# Patient Record
Sex: Female | Born: 1985 | Race: White | Hispanic: No | Marital: Married | Smoking: Never smoker
Health system: Southern US, Community
[De-identification: ages and names within clinical notes are randomized; demographics above are authoritative.]

## PROBLEM LIST (undated history)

## (undated) DIAGNOSIS — Z9889 Other specified postprocedural states: Secondary | ICD-10-CM

## (undated) DIAGNOSIS — Z789 Other specified health status: Secondary | ICD-10-CM

## (undated) DIAGNOSIS — D696 Thrombocytopenia, unspecified: Secondary | ICD-10-CM

## (undated) DIAGNOSIS — D0362 Melanoma in situ of left upper limb, including shoulder: Secondary | ICD-10-CM

## (undated) DIAGNOSIS — R112 Nausea with vomiting, unspecified: Secondary | ICD-10-CM

## (undated) HISTORY — DX: Melanoma in situ of left upper limb, including shoulder: D03.62

## (undated) HISTORY — PX: TONSILLECTOMY: SUR1361

---

## 2006-04-20 HISTORY — PX: RETINAL TEAR REPAIR CRYOTHERAPY: SHX5304

## 2010-01-13 ENCOUNTER — Ambulatory Visit: Payer: Self-pay | Admitting: Specialist

## 2011-01-18 ENCOUNTER — Observation Stay: Payer: Self-pay

## 2011-01-19 ENCOUNTER — Inpatient Hospital Stay: Payer: Self-pay

## 2013-01-16 ENCOUNTER — Ambulatory Visit: Payer: Self-pay | Admitting: Physician Assistant

## 2013-07-16 ENCOUNTER — Ambulatory Visit: Payer: Self-pay | Admitting: Family Medicine

## 2013-07-16 LAB — URINALYSIS, COMPLETE
Bilirubin,UR: NEGATIVE
Glucose,UR: NEGATIVE mg/dL (ref 0–75)
Ketone: NEGATIVE
NITRITE: NEGATIVE
PH: 7 (ref 4.5–8.0)
Protein: NEGATIVE
Specific Gravity: 1.005 (ref 1.003–1.030)
WBC UR: >30 /HPF (ref 0–5)

## 2013-07-16 LAB — PREGNANCY, URINE: Pregnancy Test, Urine: NEGATIVE m[IU]/mL

## 2013-07-18 LAB — URINE CULTURE

## 2014-04-06 ENCOUNTER — Ambulatory Visit: Payer: Self-pay | Admitting: Family Medicine

## 2014-08-04 ENCOUNTER — Other Ambulatory Visit
Admit: 2014-08-04 | Disposition: A | Discharge status: Home / Self Care | Payer: Self-pay | Attending: Advanced Practice Midwife | Admitting: Advanced Practice Midwife

## 2014-08-04 LAB — HCG, QUANTITATIVE, PREGNANCY: Beta Hcg, Quant.: 79 m[IU]/mL — ABNORMAL HIGH

## 2015-03-13 ENCOUNTER — Ambulatory Visit
Admission: EM | Admit: 2015-03-13 | Discharge: 2015-03-13 | Disposition: A | Discharge status: Home / Self Care | Payer: BLUE CROSS/BLUE SHIELD | Attending: Family Medicine | Admitting: Family Medicine

## 2015-03-13 DIAGNOSIS — L817 Pigmented purpuric dermatosis: Secondary | ICD-10-CM | POA: Diagnosis not present

## 2015-03-13 DIAGNOSIS — D696 Thrombocytopenia, unspecified: Secondary | ICD-10-CM | POA: Diagnosis not present

## 2015-03-13 HISTORY — DX: Thrombocytopenia, unspecified: D69.6

## 2015-03-13 LAB — CBC WITH DIFFERENTIAL/PLATELET
Basophils Absolute: 0.2 10*3/uL — ABNORMAL HIGH (ref 0–0.1)
Basophils Relative: 1 %
EOS PCT: 0 %
Eosinophils Absolute: 0 10*3/uL (ref 0–0.7)
HCT: 37.9 % (ref 35.0–47.0)
Hemoglobin: 13.5 g/dL (ref 12.0–16.0)
LYMPHS PCT: 13 %
Lymphs Abs: 1.6 10*3/uL (ref 1.0–3.6)
MCH: 34.7 pg — ABNORMAL HIGH (ref 26.0–34.0)
MCHC: 35.5 g/dL (ref 32.0–36.0)
MCV: 97.7 fL (ref 80.0–100.0)
Monocytes Absolute: 0.8 10*3/uL (ref 0.2–0.9)
Monocytes Relative: 6 %
NEUTROS PCT: 80 %
Neutro Abs: 10.1 10*3/uL — ABNORMAL HIGH (ref 1.4–6.5)
PLATELETS: 124 10*3/uL — AB (ref 150–440)
RBC: 3.88 MIL/uL (ref 3.80–5.20)
RDW: 13.2 % (ref 11.5–14.5)
WBC: 12.7 10*3/uL — ABNORMAL HIGH (ref 3.6–11.0)

## 2015-03-13 NOTE — ED Provider Notes (Signed)
CSN: [id number]     Arrival date & time 03/13/15  1139 History   First MD Initiated Contact with Patient 03/13/15 1314     Chief Complaint  Patient presents with  . Rash   (Consider location/radiation/quality/duration/timing/severity/associated sxs/prior Treatment) HPI Comments: 29 yo female with a h/o mild thrombocytopenia, [redacted] weeks pregnant, presents with a 1 day h/o rash to back of legs. States she was sitting for several hours yesterday and noticed rash last night after shower. Denies any tenderness, itching, pain, blisters, drainage, new soaps, detergents, or contact with plants or chemicals.  The history is provided by the patient.    Past Medical History  Diagnosis Date  . Platelets decreased ([place])    Past Surgical History  Procedure Laterality Date  . Tonsillectomy     History reviewed. No pertinent family history. Social History  Substance Use Topics  . Smoking status: Never Smoker   . Smokeless tobacco: None  . Alcohol Use: No   OB History    Gravida Para Term Preterm AB TAB SAB Ectopic Multiple Living   1              Review of Systems  Allergies  Macrobid  Home Medications   Prior to Admission medications   Medication Sig Start Date End Date Taking? Authorizing Provider  Prenatal Vit-Fe Fumarate-FA (PRENATAL VITAMINS) 28-0.8 MG TABS Take by mouth.   Yes Historical Provider, MD   Meds Ordered and Administered this Visit  Medications - No data to display  BP 113/55 mmHg  Pulse 99  Temp(Src) 97.9 F (36.6 C) (Tympanic)  Resp 16  Ht 5\' 4"  (1.626 m)  Wt 154 lb (69.854 kg)  BMI 26.42 kg/m2  SpO2 100%  LMP 08/02/2014 (Exact Date) No data found.   Physical Exam  Constitutional: She appears well-developed and well-nourished. No distress.  Pulmonary/Chest: Effort normal. No respiratory distress.  Skin: Rash noted. She is not diaphoretic.  Diffuse blanchable reddish, brown diffuse macular rash on posterior upper thigh area; non-tender; no warmth,  drainage or vesicular lesions  Nursing note and vitals reviewed.   ED Course  Procedures (including critical care time)  Labs Review Labs Reviewed  CBC WITH DIFFERENTIAL/PLATELET - Abnormal; Notable for the following:    WBC 12.7 (*)    MCH 34.7 (*)    Platelets 124 (*)    Neutro Abs 10.1 (*)    Basophils Absolute 0.2 (*)    All other components within normal limits    Imaging Review No results found.   Visual Acuity Review  Right Eye Distance:   Left Eye Distance:   Bilateral Distance:    Right Eye Near:   Left Eye Near:    Bilateral Near:         MDM   1. Pigmented purpuric dermatosis   2. Thrombocytopenia ([place])    1. Lab results (CBC) and diagnosis reviewed with patient 2. Recommend supportive treatment with otc behadryl prn (if needed for itching) 3. Follow-up prn if symptoms worsen or don't improve 4. F/u with her OB as scheduled   Norval Gable, MD 03/13/15 2148

## 2015-03-13 NOTE — ED Notes (Signed)
Noted last night to have red rash lower buttocks and upper posterior thighs. Not itching or painful. [redacted] weeks pregnant with [facility] 05/08/15

## 2015-04-10 ENCOUNTER — Inpatient Hospital Stay: Payer: BLUE CROSS/BLUE SHIELD

## 2015-04-10 ENCOUNTER — Inpatient Hospital Stay: Payer: BLUE CROSS/BLUE SHIELD | Attending: Oncology | Admitting: Oncology

## 2015-04-10 VITALS — BP 113/75 | HR 84 | Temp 97.1°F | Resp 18 | Wt 159.6 lb

## 2015-04-10 DIAGNOSIS — D696 Thrombocytopenia, unspecified: Secondary | ICD-10-CM | POA: Diagnosis not present

## 2015-04-10 DIAGNOSIS — Z79899 Other long term (current) drug therapy: Secondary | ICD-10-CM | POA: Diagnosis not present

## 2015-04-10 DIAGNOSIS — O99113 Other diseases of the blood and blood-forming organs and certain disorders involving the immune mechanism complicating pregnancy, third trimester: Secondary | ICD-10-CM | POA: Diagnosis not present

## 2015-04-10 LAB — CBC
HEMATOCRIT: 38 % (ref 35.0–47.0)
HEMOGLOBIN: 13.4 g/dL (ref 12.0–16.0)
MCH: 34.8 pg — ABNORMAL HIGH (ref 26.0–34.0)
MCHC: 35.1 g/dL (ref 32.0–36.0)
MCV: 99.1 fL (ref 80.0–100.0)
Platelets: 129 10*3/uL — ABNORMAL LOW (ref 150–440)
RBC: 3.84 MIL/uL (ref 3.80–5.20)
RDW: 12.9 % (ref 11.5–14.5)
WBC: 11 10*3/uL (ref 3.6–11.0)

## 2015-04-10 LAB — FERRITIN: Ferritin: 30 ng/mL (ref 11–307)

## 2015-04-10 LAB — FOLATE: FOLATE: 17.6 ng/mL (ref >5.9)

## 2015-04-10 NOTE — Progress Notes (Signed)
Patient is pregnant with her 2nd child with due date on 05/08/2015.  With the first pregnancy she did have low platelets but was unknown at the time.

## 2015-04-11 LAB — VITAMIN B12: Vitamin B-12: 310 pg/mL (ref 180–914)

## 2015-04-12 NOTE — Progress Notes (Signed)
[place]  [phone] [phone]  ID: Mammie Pauls OB: 03-Sep-1985  MR#: [id number]  [id number]  Patient Care Team: Malachy Mood, MD as PCP - General (Obstetrics and Gynecology)  CHIEF COMPLAINT:  Chief Complaint  Patient presents with  . New Evaluation    gestational thrombocytopenia    INTERVAL HISTORY: Patient is a 29 year old female who is pregnant with her second child and her due date is May 08, 2015. She was found to have a mildly decreased platelet count and has been referred for further evaluation. Patient also had thrombocytopenia with her first pregnancy, but was unaware until she was unable to have an epidural placed during labor. Currently, she feels well and is asymptomatic. She denies any easy bleeding or bruising. She is gaining weight appropriately. She has no neurologic complaints. She denies any recent fevers. She has no chest pain or shortness of breath. She denies any nausea, vomiting, constipation, or diarrhea. She has no urinary complaints. Patient offers no specific complaints today.  REVIEW OF SYSTEMS:   Review of Systems  Constitutional: Negative.  Negative for fever and malaise/fatigue.  Respiratory: Negative.   Cardiovascular: Negative.   Gastrointestinal: Negative.   Musculoskeletal: Negative.   Neurological: Negative.  Negative for weakness.  Endo/Heme/Allergies: Does not bruise/bleed easily.    As per HPI. Otherwise, a complete review of systems is negatve.  PAST MEDICAL HISTORY: Past Medical History  Diagnosis Date  . Platelets decreased ([place])     PAST SURGICAL HISTORY: Past Surgical History  Procedure Laterality Date  . Tonsillectomy      FAMILY HISTORY: Reviewed and unchanged. No reported history of chronic disease.     ADVANCED DIRECTIVES:    HEALTH MAINTENANCE: Social History  Substance Use Topics  . Smoking status: Never Smoker   . Smokeless tobacco: Not on file  .  Alcohol Use: No     Colonoscopy:  PAP:  Bone density:  Lipid panel:  Allergies  Allergen Reactions  . Macrobid [Nitrofurantoin Monohyd Macro] Hives    Current Outpatient Prescriptions  Medication Sig Dispense Refill  . amoxicillin-clavulanate (AUGMENTIN) 875-125 MG tablet TK 1 T PO  BID FOR 10 DAYS.  0  . benzonatate (TESSALON) 100 MG capsule TK 1 C PO Q 8 HOURS PRF COUGH. SWALLOW WHOLE  0  . Prenatal Vit-Fe Fumarate-FA (PRENATAL VITAMINS) 28-0.8 MG TABS Take by mouth.    Marland Kitchen PULMICORT FLEXHALER 90 MCG/ACT inhaler   2   No current facility-administered medications for this visit.    OBJECTIVE: Filed Vitals:   04/10/15 1520  BP: 113/75  Pulse: 84  Temp: 97.1 F (36.2 C)  Resp: 18     Body mass index is 27.38 kg/(m^2).    ECOG FS:0 - Asymptomatic  General: Well-developed, well-nourished, no acute distress. Eyes: Pink conjunctiva, anicteric sclera. HEENT: Normocephalic, moist mucous membranes, clear oropharnyx. Lungs: Clear to auscultation bilaterally. Heart: Regular rate and rhythm. No rubs, murmurs, or gallops. Abdomen: Appears appropriate for gestational age. Musculoskeletal: No edema, cyanosis, or clubbing. Neuro: Alert, answering all questions appropriately. Cranial nerves grossly intact. Skin: No rashes or petechiae noted. Psych: Normal affect. Lymphatics: No cervical, calvicular, axillary or inguinal LAD.   LAB RESULTS:  No results found for: NA, K, CL, CO2, GLUCOSE, BUN, CREATININE, CALCIUM, PROT, ALBUMIN, AST, ALT, ALKPHOS, BILITOT, GFRNONAA, GFRAA  Lab Results  Component Value Date   WBC 11.0 04/10/2015   NEUTROABS 10.1* 03/13/2015   HGB 13.4 04/10/2015   HCT 38.0 04/10/2015   MCV 99.1  04/10/2015   PLT 129* 04/10/2015     STUDIES: No results found.  ASSESSMENT: Thrombocytopenia in pregnancy.  PLAN:    1. Thrombocytopenia: Patient's platelet count is only mildly decreased at 129. The remainder of her lab work is either negative or within normal  limits. No intervention is needed at this time. Goal will be to maintain platelet count greater than 100 at the time of birth. Patient will return to clinic in mid-January for repeat laboratory work and further evaluation. 2. Pregnancy: Patient's due date is May 08, 2015, although she states that she has a scheduled induction 1 week prior. If patient's platelet count is less than 100 at time of induction, would try oral prednisone 1 mg/kg daily in attempt to improve her platelet count. 3. Follow-up: Mid-January as above. If patient gives birth prior, will schedule follow-up 2-3 months postpartum to ensure platelets have returned to normal.  Patient expressed understanding and was in agreement with this plan. She also understands that She can call clinic at any time with any questions, concerns, or complaints.    Lloyd Huger, MD   04/12/2015 1:34 PM

## 2015-05-03 ENCOUNTER — Observation Stay: Payer: BLUE CROSS/BLUE SHIELD | Admitting: Anesthesiology

## 2015-05-03 ENCOUNTER — Observation Stay
Admission: EM | Admit: 2015-05-03 | Discharge: 2015-05-03 | Disposition: A | Discharge status: Home / Self Care | Payer: BLUE CROSS/BLUE SHIELD | Source: Home / Self Care | Admitting: Obstetrics and Gynecology

## 2015-05-03 ENCOUNTER — Inpatient Hospital Stay
Admission: EM | Admit: 2015-05-03 | Discharge: 2015-05-06 | DRG: 765 | Disposition: A | Discharge status: Home / Self Care | Payer: BLUE CROSS/BLUE SHIELD | Attending: Obstetrics & Gynecology | Admitting: Obstetrics & Gynecology

## 2015-05-03 ENCOUNTER — Encounter
Admission: EM | Disposition: A | Discharge status: Home / Self Care | Payer: Self-pay | Source: Home / Self Care | Attending: Obstetrics & Gynecology

## 2015-05-03 ENCOUNTER — Encounter: Payer: Self-pay | Admitting: Anesthesiology

## 2015-05-03 ENCOUNTER — Inpatient Hospital Stay
Admission: RE | Admit: 2015-05-03 | Payer: BLUE CROSS/BLUE SHIELD | Source: Ambulatory Visit | Admitting: Obstetrics and Gynecology

## 2015-05-03 DIAGNOSIS — Z98891 History of uterine scar from previous surgery: Secondary | ICD-10-CM

## 2015-05-03 DIAGNOSIS — D62 Acute posthemorrhagic anemia: Secondary | ICD-10-CM | POA: Diagnosis not present

## 2015-05-03 DIAGNOSIS — O9912 Other diseases of the blood and blood-forming organs and certain disorders involving the immune mechanism complicating childbirth: Secondary | ICD-10-CM | POA: Diagnosis present

## 2015-05-03 DIAGNOSIS — O9081 Anemia of the puerperium: Secondary | ICD-10-CM | POA: Diagnosis not present

## 2015-05-03 DIAGNOSIS — Z3A39 39 weeks gestation of pregnancy: Secondary | ICD-10-CM | POA: Diagnosis not present

## 2015-05-03 DIAGNOSIS — Z3493 Encounter for supervision of normal pregnancy, unspecified, third trimester: Secondary | ICD-10-CM

## 2015-05-03 DIAGNOSIS — D696 Thrombocytopenia, unspecified: Secondary | ICD-10-CM | POA: Diagnosis present

## 2015-05-03 DIAGNOSIS — Z3483 Encounter for supervision of other normal pregnancy, third trimester: Secondary | ICD-10-CM | POA: Diagnosis present

## 2015-05-03 HISTORY — DX: Other specified health status: Z78.9

## 2015-05-03 HISTORY — DX: Other specified postprocedural states: Z98.890

## 2015-05-03 HISTORY — DX: Nausea with vomiting, unspecified: R11.2

## 2015-05-03 LAB — TYPE AND SCREEN
ABO/RH(D): O NEG
ANTIBODY SCREEN: POSITIVE

## 2015-05-03 LAB — CBC WITH DIFFERENTIAL/PLATELET
BASOS PCT: 1 %
Basophils Absolute: 0.2 10*3/uL — ABNORMAL HIGH (ref 0–0.1)
EOS ABS: 0 10*3/uL (ref 0–0.7)
Eosinophils Relative: 0 %
HCT: 41.3 % (ref 35.0–47.0)
HEMOGLOBIN: 14 g/dL (ref 12.0–16.0)
LYMPHS ABS: 1.3 10*3/uL (ref 1.0–3.6)
Lymphocytes Relative: 7 %
MCH: 33.6 pg (ref 26.0–34.0)
MCHC: 34 g/dL (ref 32.0–36.0)
MCV: 98.9 fL (ref 80.0–100.0)
Monocytes Absolute: 0.9 10*3/uL (ref 0.2–0.9)
Monocytes Relative: 5 %
NEUTROS ABS: 15.6 10*3/uL — AB (ref 1.4–6.5)
NEUTROS PCT: 87 %
Platelets: 123 10*3/uL — ABNORMAL LOW (ref 150–440)
RBC: 4.17 MIL/uL (ref 3.80–5.20)
RDW: 12.9 % (ref 11.5–14.5)
WBC: 18 10*3/uL — AB (ref 3.6–11.0)

## 2015-05-03 LAB — ABO/RH: ABO/RH(D): O NEG

## 2015-05-03 LAB — RAPID HIV SCREEN (HIV 1/2 AB+AG)
HIV 1/2 ANTIBODIES: NONREACTIVE
HIV-1 P24 ANTIGEN - HIV24: NONREACTIVE

## 2015-05-03 SURGERY — Surgical Case
Anesthesia: Spinal

## 2015-05-03 MED ORDER — OXYCODONE-ACETAMINOPHEN 5-325 MG PO TABS: 1.0000 | ORAL_TABLET | ORAL | Status: DC | PRN

## 2015-05-03 MED ORDER — CITRIC ACID-SODIUM CITRATE 334-500 MG/5ML PO SOLN
30.0000 mL | ORAL | Status: AC
  Administered 2015-05-03: 30 mL via ORAL
  Filled 2015-05-03: qty 15

## 2015-05-03 MED ORDER — MEPERIDINE HCL 25 MG/ML IJ SOLN
INTRAMUSCULAR | Status: AC
  Administered 2015-05-03: 6.25 mg via INTRAVENOUS
  Filled 2015-05-03: qty 1

## 2015-05-03 MED ORDER — ZOLPIDEM TARTRATE 5 MG PO TABS: 5.0000 mg | ORAL_TABLET | Freq: Every evening | ORAL | Status: DC | PRN

## 2015-05-03 MED ORDER — BUPIVACAINE 0.25 % ON-Q PUMP DUAL CATH 400 ML
INJECTION | Status: AC
  Filled 2015-05-03: qty 400

## 2015-05-03 MED ORDER — OXYCODONE-ACETAMINOPHEN 5-325 MG PO TABS: 2.0000 | ORAL_TABLET | ORAL | Status: DC | PRN

## 2015-05-03 MED ORDER — ACETAMINOPHEN 325 MG PO TABS: 650.0000 mg | ORAL_TABLET | ORAL | Status: DC | PRN

## 2015-05-03 MED ORDER — LACTATED RINGERS IV SOLN
INTRAVENOUS | Status: DC
  Administered 2015-05-03: 17:00:00 via INTRAVENOUS

## 2015-05-03 MED ORDER — CITRIC ACID-SODIUM CITRATE 334-500 MG/5ML PO SOLN: 30.0000 mL | ORAL | Status: DC | PRN

## 2015-05-03 MED ORDER — ONDANSETRON HCL 4 MG/2ML IJ SOLN: 4.0000 mg | Freq: Once | INTRAMUSCULAR | Status: DC | PRN

## 2015-05-03 MED ORDER — CEFAZOLIN SODIUM-DEXTROSE 2-3 GM-% IV SOLR
2.0000 g | INTRAVENOUS | Status: AC
  Administered 2015-05-03: 2 g via INTRAVENOUS

## 2015-05-03 MED ORDER — LIDOCAINE HCL (PF) 1 % IJ SOLN: 30.0000 mL | INTRAMUSCULAR | Status: DC | PRN

## 2015-05-03 MED ORDER — LACTATED RINGERS IV SOLN
INTRAVENOUS | Status: DC
  Administered 2015-05-04: via INTRAVENOUS

## 2015-05-03 MED ORDER — LACTATED RINGERS IV SOLN: 500.0000 mL | INTRAVENOUS | Status: DC | PRN

## 2015-05-03 MED ORDER — BUPIVACAINE 0.25 % ON-Q PUMP DUAL CATH 400 ML: 400.0000 mL | INJECTION | Status: DC

## 2015-05-03 MED ORDER — LACTATED RINGERS IV BOLUS (SEPSIS)
1000.0000 mL | Freq: Once | INTRAVENOUS | Status: AC
Start: 2015-05-03 — End: 2015-05-03
  Administered 2015-05-03: 1000 mL via INTRAVENOUS

## 2015-05-03 MED ORDER — BUPIVACAINE HCL 0.5 % IJ SOLN
10.0000 mL | Freq: Once | INTRAMUSCULAR | Status: DC
  Filled 2015-05-03: qty 10

## 2015-05-03 MED ORDER — PHENYLEPHRINE HCL 10 MG/ML IJ SOLN
INTRAMUSCULAR | Status: DC | PRN
  Administered 2015-05-03 (×2): 50 ug via INTRAVENOUS

## 2015-05-03 MED ORDER — KETOROLAC TROMETHAMINE 30 MG/ML IJ SOLN
30.0000 mg | Freq: Four times a day (QID) | INTRAMUSCULAR | Status: AC
  Administered 2015-05-04 (×4): 30 mg via INTRAVENOUS
  Filled 2015-05-03 (×4): qty 1

## 2015-05-03 MED ORDER — BUTORPHANOL TARTRATE 1 MG/ML IJ SOLN
1.0000 mg | INTRAMUSCULAR | Status: DC | PRN
Start: 2015-05-03 — End: 2015-05-03
  Administered 2015-05-03: 1 mg via INTRAVENOUS

## 2015-05-03 MED ORDER — SODIUM CHLORIDE 0.9 % IR SOLN
Status: DC | PRN
  Administered 2015-05-03: 500 mL

## 2015-05-03 MED ORDER — BUTORPHANOL TARTRATE 1 MG/ML IJ SOLN
INTRAMUSCULAR | Status: AC
  Administered 2015-05-03: 1 mg via INTRAVENOUS
  Filled 2015-05-03: qty 2

## 2015-05-03 MED ORDER — MISOPROSTOL 200 MCG PO TABS
ORAL_TABLET | ORAL | Status: AC
  Filled 2015-05-03: qty 4

## 2015-05-03 MED ORDER — SIMETHICONE 80 MG PO CHEW
80.0000 mg | CHEWABLE_TABLET | Freq: Three times a day (TID) | ORAL | Status: DC
  Administered 2015-05-04 – 2015-05-06 (×7): 80 mg via ORAL
  Filled 2015-05-03 (×7): qty 1

## 2015-05-03 MED ORDER — SENNOSIDES-DOCUSATE SODIUM 8.6-50 MG PO TABS
2.0000 | ORAL_TABLET | ORAL | Status: DC
  Administered 2015-05-04 – 2015-05-06 (×3): 2 via ORAL
  Filled 2015-05-03 (×3): qty 2

## 2015-05-03 MED ORDER — LANOLIN HYDROUS EX OINT: 1.0000 "application " | TOPICAL_OINTMENT | CUTANEOUS | Status: DC | PRN

## 2015-05-03 MED ORDER — PRENATAL MULTIVITAMIN CH
1.0000 | ORAL_TABLET | Freq: Every day | ORAL | Status: DC
  Administered 2015-05-04 – 2015-05-06 (×3): 1 via ORAL
  Filled 2015-05-03 (×3): qty 1

## 2015-05-03 MED ORDER — BUPIVACAINE IN DEXTROSE 0.75-8.25 % IT SOLN
INTRATHECAL | Status: DC | PRN
  Administered 2015-05-03: 1.8 mL via INTRATHECAL

## 2015-05-03 MED ORDER — SIMETHICONE 80 MG PO CHEW
80.0000 mg | CHEWABLE_TABLET | ORAL | Status: DC
  Administered 2015-05-04 – 2015-05-06 (×3): 80 mg via ORAL
  Filled 2015-05-03 (×3): qty 1

## 2015-05-03 MED ORDER — FENTANYL CITRATE (PF) 100 MCG/2ML IJ SOLN: 25.0000 ug | INTRAMUSCULAR | Status: DC | PRN

## 2015-05-03 MED ORDER — WITCH HAZEL-GLYCERIN EX PADS: 1.0000 "application " | MEDICATED_PAD | CUTANEOUS | Status: DC | PRN

## 2015-05-03 MED ORDER — MEPERIDINE HCL 25 MG/ML IJ SOLN
6.2500 mg | INTRAMUSCULAR | Status: AC | PRN
Start: 2015-05-03 — End: 2015-05-03
  Administered 2015-05-03 (×2): 6.25 mg via INTRAVENOUS

## 2015-05-03 MED ORDER — DIPHENHYDRAMINE HCL 25 MG PO CAPS: 25.0000 mg | ORAL_CAPSULE | Freq: Four times a day (QID) | ORAL | Status: DC | PRN

## 2015-05-03 MED ORDER — ONDANSETRON HCL 4 MG/2ML IJ SOLN
INTRAMUSCULAR | Status: DC | PRN
  Administered 2015-05-03: 4 mg via INTRAVENOUS

## 2015-05-03 MED ORDER — CEFAZOLIN SODIUM-DEXTROSE 2-3 GM-% IV SOLR
INTRAVENOUS | Status: AC
  Filled 2015-05-03: qty 50

## 2015-05-03 MED ORDER — ONDANSETRON HCL 4 MG/2ML IJ SOLN: 4.0000 mg | Freq: Four times a day (QID) | INTRAMUSCULAR | Status: DC | PRN

## 2015-05-03 MED ORDER — MENTHOL 3 MG MT LOZG
1.0000 | LOZENGE | OROMUCOSAL | Status: DC | PRN
Start: 2015-05-03 — End: 2015-05-06
  Filled 2015-05-03: qty 9

## 2015-05-03 MED ORDER — DIBUCAINE 1 % RE OINT: 1.0000 "application " | TOPICAL_OINTMENT | RECTAL | Status: DC | PRN

## 2015-05-03 MED ORDER — OXYTOCIN 40 UNITS IN LACTATED RINGERS INFUSION - SIMPLE MED
INTRAVENOUS | Status: AC
  Administered 2015-05-03: 1000 mL via INTRAVENOUS
  Filled 2015-05-03: qty 1000

## 2015-05-03 MED ORDER — BUPIVACAINE HCL (PF) 0.5 % IJ SOLN
INTRAMUSCULAR | Status: DC | PRN
  Administered 2015-05-03: 12 mL

## 2015-05-03 MED ORDER — SIMETHICONE 80 MG PO CHEW: 80.0000 mg | CHEWABLE_TABLET | ORAL | Status: DC | PRN

## 2015-05-03 MED ORDER — OXYTOCIN 10 UNIT/ML IJ SOLN
2.5000 [IU]/h | INTRAVENOUS | Status: AC
  Filled 2015-05-03: qty 4

## 2015-05-03 MED ORDER — OXYTOCIN 10 UNIT/ML IJ SOLN
INTRAMUSCULAR | Status: AC
  Filled 2015-05-03: qty 2

## 2015-05-03 MED ORDER — OXYTOCIN 40 UNITS IN LACTATED RINGERS INFUSION - SIMPLE MED
INTRAVENOUS | Status: AC
  Filled 2015-05-03: qty 1000

## 2015-05-03 MED ORDER — OXYTOCIN BOLUS FROM INFUSION: 500.0000 mL | INTRAVENOUS | Status: DC

## 2015-05-03 MED ORDER — MORPHINE SULFATE (PF) 0.5 MG/ML IJ SOLN
INTRAMUSCULAR | Status: DC | PRN
  Administered 2015-05-03: .2 mg via EPIDURAL

## 2015-05-03 MED ORDER — MORPHINE SULFATE (PF) 2 MG/ML IV SOLN: 1.0000 mg | INTRAVENOUS | Status: DC | PRN

## 2015-05-03 MED ORDER — BUPIVACAINE HCL (PF) 0.5 % IJ SOLN
INTRAMUSCULAR | Status: AC
  Filled 2015-05-03: qty 30

## 2015-05-03 SURGICAL SUPPLY — 29 items
BAG COUNTER SPONGE EZ (MISCELLANEOUS) ×6 IMPLANT
CANISTER SUCT 3000ML (MISCELLANEOUS) ×2 IMPLANT
CATH KIT ON-Q SILVERSOAK 5IN (CATHETERS) ×4 IMPLANT
CHLORAPREP W/TINT 26ML (MISCELLANEOUS) ×4 IMPLANT
DRSG TELFA 3X8 NADH (GAUZE/BANDAGES/DRESSINGS) ×2 IMPLANT
ELECT CAUTERY BLADE 6.4 (BLADE) ×2 IMPLANT
GAUZE SPONGE 4X4 12PLY STRL (GAUZE/BANDAGES/DRESSINGS) ×2 IMPLANT
GLOVE BIO SURGEON STRL SZ7 (GLOVE) ×8 IMPLANT
GLOVE BIO SURGEON STRL SZ8 (GLOVE) ×2 IMPLANT
GLOVE BIOGEL PI IND STRL 7.5 (GLOVE) ×1 IMPLANT
GLOVE BIOGEL PI INDICATOR 7.5 (GLOVE) ×1
GLOVE INDICATOR 7.5 STRL GRN (GLOVE) ×4 IMPLANT
GOWN STRL REUS W/ TWL LRG LVL3 (GOWN DISPOSABLE) ×4 IMPLANT
GOWN STRL REUS W/TWL LRG LVL3 (GOWN DISPOSABLE) ×4
HEMOSTAT ARISTA ABSORB 3G PWDR (MISCELLANEOUS) ×2 IMPLANT
LIQUID BAND (GAUZE/BANDAGES/DRESSINGS) ×4 IMPLANT
NS IRRIG 1000ML POUR BTL (IV SOLUTION) ×2 IMPLANT
PACK C SECTION AR (MISCELLANEOUS) ×2 IMPLANT
PAD GROUND ADULT SPLIT (MISCELLANEOUS) ×2 IMPLANT
PAD OB MATERNITY 4.3X12.25 (PERSONAL CARE ITEMS) ×2 IMPLANT
PAD PREP 24X41 OB/GYN DISP (PERSONAL CARE ITEMS) ×2 IMPLANT
SPONGE LAP 18X18 5 PK (GAUZE/BANDAGES/DRESSINGS) ×6 IMPLANT
STAPLER INSORB 30 2030 C-SECTI (MISCELLANEOUS) ×2 IMPLANT
STRIP CLOSURE SKIN 1/2X4 (GAUZE/BANDAGES/DRESSINGS) ×2 IMPLANT
SUT MNCRL AB 4-0 PS2 18 (SUTURE) ×2 IMPLANT
SUT PDS AB 1 TP1 96 (SUTURE) ×4 IMPLANT
SUT VIC AB 0 CTX 36 (SUTURE) ×4
SUT VIC AB 0 CTX36XBRD ANBCTRL (SUTURE) ×4 IMPLANT
SUT VIC AB 2-0 CT1 36 (SUTURE) ×2 IMPLANT

## 2015-05-03 NOTE — Progress Notes (Signed)
  Labor Progress Note   30 y.o. [id number] @ [redacted]w[redacted]d , admitted for labor observation, contractions  Subjective:  Having a difficult time coping with labor because of what happened with first labor. Contractions are getting closer together and getting stronger.  Objective:  BP 124/68 mmHg  Pulse 91  Resp 20  LMP 08/02/2014 (Exact Date) Abd: gravid, non tender to palpation.  Extr: No edema, no signs or symptoms of DVT SVE: Dilation 1cm, Effacement 70%, Station -2 Nitrazine negative, Fern negative, Pooling negative  EFM: Baseline 150 bpm, moderate variability, + accelerations, - decelerations.  Toco: Q 2-3 minutes, moderate strength to palpation Labs: none   Assessment & Plan:  G3P1011 @ [redacted]w[redacted]d, admitted for  Pregnancy and Labor/Delivery Observation  1. Walk around Unit for 1 hour and reevaluate 2. FWB: FHT category 1.  3. ID: GBS negative 4. Labor vs C/Section 5. CBC Plan discussed with Dr Georgianne Fick who has seen pt for the bulk of her prenatal care  Rod Can, [place]  05/03/15 1550  This patient and plan were discussed with Dr's Georgianne Fick and Kenton Kingfisher 05/03/2015

## 2015-05-03 NOTE — OR Nursing (Signed)
Fundus firm post-op. EBL 962ml, Foley 188ml.

## 2015-05-03 NOTE — H&P (Signed)
Obstetric H&P   Chief Complaint: Contraction, leaking fluid  Prenatal Care Provider: [facility]  History of Present Illness: 30 y.o. [id number] [redacted]w[redacted]d by 05/08/2015, presenting with complaints of contractions and leaking fluid.  +FM, no VB.  Patient has had an uncomplicated pregnancy other than mild thrombocytopenia nadir at 122 with most recent value a week ago 148.  Has had hematology consult.  Her obstetrical history is noteable for prior pregnancy with thrombocytopenia platelets less than 100K, 3rd degree laceration.  Growth scan at 36 week was appropriate with normal fluid.  THe patient is scheduled for primary LTCS on 1/19 secondary to a history of birth trauma.  She is aware that date suggests no recurrence risk of third degree but also no way of definitively ruling out.  Risk benefits of primary section vs attempted vaginal delivery have been discussed in detail patient desire to proceed with C-section  Review of Systems: 10 point review of systems negative unless otherwise noted in HPI  Past Medical History: Past Medical History  Diagnosis Date  . Platelets decreased ([place])   . PONV (postoperative nausea and vomiting)   . Medical history non-contributory     Past Surgical History: Past Surgical History  Procedure Laterality Date  . Tonsillectomy    . Retinal tear repair cryotherapy Right 2008    Family History: No family history on file.  Social History: Social History   Social History  . Marital Status: Married    Spouse Name: N/A  . Number of Children: N/A  . Years of Education: N/A   Occupational History  . Not on file.   Social History Main Topics  . Smoking status: Never Smoker   . Smokeless tobacco: Not on file  . Alcohol Use: No  . Drug Use: Not on file  . Sexual Activity: Yes    Birth Control/ Protection: Pill   Other Topics Concern  . Not on file   Social History Narrative    Medications: Prior to Admission medications   Medication Sig Start Date End Date  Taking? Authorizing Provider  Prenatal Vit-Fe Fumarate-FA (PRENATAL VITAMINS) 28-0.8 MG TABS Take by mouth.   Yes Historical Provider, MD  amoxicillin-clavulanate (AUGMENTIN) 875-125 MG tablet Reported on 05/03/2015 04/05/15   Historical Provider, MD  benzonatate (TESSALON) 100 MG capsule Reported on 05/03/2015 04/05/15   Historical Provider, MD  Doug Sou 90 MCG/ACT inhaler  04/05/15   Historical Provider, MD    Allergies: Allergies  Allergen Reactions  . Macrobid [Nitrofurantoin Monohyd Macro] Hives    Physical Exam: Vitals: Blood pressure 120/53, pulse 89, resp. rate 20, last menstrual period 08/02/2014.  FHT: 150, moderate, positive accels, no decels Toco: q50min  General: nNAD Pulmonary: CTAB Cardiovascular: RRR Abdomen: Gravid, non-tender Leopolds: vtx Genitourinary: 1/50/31 Extremities: no edema  Labs: No results found for this or any previous visit (from the past 24 hour(s)).  Assessment: 30 y.o. [id number] [redacted]w[redacted]d by 05/08/2015, term labor history birth trauma  Plan: 1) Labor in setting of birth trauma - proceed with 1LTCS  2) Fetus - cat I tracing  3) TDAP and influenza up to date  4) Disposition - pending delivery

## 2015-05-03 NOTE — Progress Notes (Signed)
  Labor Progress Note   30 y.o. [id number] @ [redacted]w[redacted]d , admitted for labor observation, contractions  Subjective:  Pt thinks water may have broken  Objective:  BP 120/53 mmHg  Pulse 89  Resp 20  LMP 08/02/2014 (Exact Date) Abd: gravid, non tender to palpation.  Extr: No edema, no signs or symptoms of DVT SVE: Dilation 1cm, Effacement 70%, Station -2 Nitrazine positive and puddle of fluid on pad per RN check  EFM: Baseline 150 bpm, moderate variability, + accelerations, - decelerations.  Toco: Q 2-3 minutes, moderate strength to palpation Labs: none   Assessment & Plan:  G3P1011 @ [redacted]w[redacted]d,   Admit for C/Section per plan with Dr Georgianne Fick  1. Fern negative- may have been due to insufficient time for drying slide  Dawn Gibbs, CNM  05/03/15 1640  This patient and plan were discussed with Dr Kenton Kingfisher 05/03/2015

## 2015-05-03 NOTE — Op Note (Signed)
Preoperative Diagnosis: 1) 30 y.o. [id number] at [redacted]w[redacted]d 2) History of birth trauma 3) Term labor  Postoperative Diagnosis: 1) 30 y.o. [id number] at [redacted]w[redacted]d 2) History of birth trauma 3) Term labor  Operation Performed: Primary low transverse C-section via pfannenstiel skin incision  Indication: History of 3rd degree laceration desiring primary C-section for delivery  Anesthesia: .Spinal  Primary Surgeon: Malachy Mood, MD  Assistant: Barnett Applebaum MD  Preoperative Antibiotics: 2g ancef  Estimated Blood Loss: * No blood loss amount entered *  IV Fluids: 2559mL cyrstaloid  Urine Output:: 133mL  Drains or Tubes: Foley to gravity drainage, ON-Q catheter system  Implants: none  Specimens Removed: none  Complications: none  Intraoperative Findings:  Normal tubes ovaries and uterus.  Delivery resulted in the birth of a liveborn female APGAR (1 MIN): 9   APGAR (5 MINS): 9 , weight 3130g  Patient Condition: stable  Procedure in Detail:  Patient was taken to the operating room were she was administered regional anesthesia.  She was positioned in the supine position, prepped and draped in the  Usual sterile fashion.  Prior to proceeding with the case a time out was performed and the level of anesthetic was checked and noted to be adequate.  Utilizing the scalpel a pfannenstiel skin incision was made 2cm above the pubic symphysis and carried down sharply to the the level of the rectus fascia.  The fascia was incised in the midline using the scalpel and then extended using mayo scissors.  The superior border of the rectus fascia was grasped with two Kocher clamps and the underlying rectus muscles were dissected of the fascia using blunt dissection.  The median raphae was incised using Mayo scissors.   The inferior border of the rectus fascia was dissected of the rectus muscles in a similar fashion.  The midline was identified, the peritoneum was entered bluntly and expanded using manual  tractions.  The uterus was noted to be in a none rotated position.  Next the bladder blade was placed retracting the bladder caudally.  A bladder flap was not created.  A low transverse incision was scored on the lower uterine segment.  The hysterotomy was entered bluntly using the operators finger.  The hysterotomy incision was extended using manual traction.  The operators hand was placed within the hysterotomy position noting the fetus to be within the OA position.  The vertex was grasped, flexed, brought to the incision, and delivered a traumatically using fundal pressure.  The remainder of the body delivered with ease.  The infant was suctioned, cord was clamped and cut before handing off to the awaiting neonatologist.  The placenta was delivered using manual extraction.  The uterus was exteriorized, wiped clean of clots and debris using two moist laps.  There was some degree of uterine atony which resolved with fundal massage and after starting IV pitocin.  The hysterotomy was closed using a two layer closure of 0 Vicryl, with the first being a running locked, the second a vertical imbricating.  Several area on lower utrine segment were also made hemostatic using 0 Vicryl figure of 8 stitches.  There was some increased bleeding from the left aspect of the hysterotomy which required placement of an O'leary suture using 0 Vicryl to achieve hemostasis.  The uterus was returned to the abdomen.  The peritoneal gutters were wiped clean of clots and debris using two moist laps.  The hysterotomy incision was re-inspected noted to be hemostatic.  3g Arista powder was applied.  The  rectus muscles were re-approximated in the midline using a single 2-0 Vicryl mattress stitch.  The rectus muscles were inspected noted to be hemostatic.  The superior border of the rectus fascia was grasped with a Kocher clamp.  The ON-Q trocars were then placed 4cm above the superior border of the incision and tunneled subfascially.  The  introducers were removed and the catheters were threaded through the sleeves after which the sleeves were removed.  The fascia was closed using a looped #1 PDS in a running fashion taking 1cm by 1cm bites.  The subcutaneous tissue was irrigated using warm saline, hemostasis achieved using the bovis.  The subcutaneous dead space was less 3cm and was not closed.  The skin was closed using insorb staples the right lateral aspect was also sown with 4-0 monocryl.  Sponge needle and instrument counts were corrects times two.  The patient tolerated the procedure well and was taken to the recovery room in stable condition.

## 2015-05-03 NOTE — Anesthesia Preprocedure Evaluation (Addendum)
Anesthesia Evaluation  Patient identified by MRN, date of birth, ID band Patient awake    Reviewed: Allergy & Precautions, NPO status , Patient's Chart, lab work & pertinent test results  History of Anesthesia Complications (+) PONV  Airway Mallampati: II  TM Distance: >3 FB Neck ROM: Full    Dental no notable dental hx.    Pulmonary neg pulmonary ROS,    Pulmonary exam normal breath sounds clear to auscultation       Cardiovascular negative cardio ROS Normal cardiovascular exam     Neuro/Psych negative neurological ROS  negative psych ROS   GI/Hepatic negative GI ROS, Neg liver ROS,   Endo/Other  negative endocrine ROS  Renal/GU negative Renal ROS  negative genitourinary   Musculoskeletal negative musculoskeletal ROS (+)   Abdominal Normal abdominal exam  (+)   Peds negative pediatric ROS (+)  Hematology negative hematology ROS (+)   Anesthesia Other Findings   Reproductive/Obstetrics (+) Pregnancy                            Anesthesia Physical Anesthesia Plan  ASA: II and emergent  Anesthesia Plan: Spinal   Post-op Pain Management:    Induction:   Airway Management Planned: Nasal Cannula  Additional Equipment:   Intra-op Plan:   Post-operative Plan:   Informed Consent: I have reviewed the patients History and Physical, chart, labs and discussed the procedure including the risks, benefits and alternatives for the proposed anesthesia with the patient or authorized representative who has indicated his/her understanding and acceptance.   Dental advisory given  Plan Discussed with: CRNA and Surgeon  Anesthesia Plan Comments: (Spinal narcotics for post op pain control)       Anesthesia Quick Evaluation

## 2015-05-03 NOTE — Transfer of Care (Signed)
Immediate Anesthesia Transfer of Care Note  Patient: Dawn Gibbs  Procedure(s) Performed: Procedure(s): CESAREAN SECTION (N/A)  Patient Location: PACU and Mother/Baby  Anesthesia Type:Spinal  Level of Consciousness: awake, alert  and oriented  Airway & Oxygen Therapy: Patient Spontanous Breathing  Post-op Assessment: Report given to RN and Post -op Vital signs reviewed and stable  Post vital signs: Reviewed and stable  Last Vitals:  Filed Vitals:   05/03/15 1712 05/03/15 1845  BP:  97/56  Pulse:    Temp: 36.8 C 36.5 C  Resp:  18    Complications: No apparent anesthesia complications

## 2015-05-03 NOTE — Discharge Instructions (Signed)
Cesarean Delivery, Care After  Refer to this sheet in the next few weeks. These instructions provide you with information on caring for yourself after your procedure. Your health care provider may also give you specific instructions. Your treatment has been planned according to current medical practices, but problems sometimes occur. Call your health care provider if you have any problems or questions after you go home.  HOME CARE INSTRUCTIONS   Only take over-the-counter or prescription medications as directed by your health care provider.   Do not drink alcohol, especially if you are breastfeeding or taking medication to relieve pain.   Do not chew or smoke tobacco.   Continue to use good perineal care. Good perineal care includes:    Wiping your perineum from front to back.    Keeping your perineum clean.   Check your surgical cut (incision) daily for increased redness, drainage, swelling, or separation of skin.   Clean your incision gently with soap and water every day, and then pat it dry. If your health care provider says it is okay, leave the incision uncovered. Use a bandage (dressing) if the incision is draining fluid or appears irritated. If the adhesive strips across the incision do not fall off within 7 days, carefully peel them off.   Hug a pillow when coughing or sneezing until your incision is healed. This helps to relieve pain.   Do not use tampons or douche until your health care provider says it is okay.   Shower, wash your hair, and take tub baths as directed by your health care provider.   Wear a well-fitting bra that provides breast support.   Limit wearing support panties or control-top hose.   Drink enough fluids to keep your urine clear or pale yellow.   Eat high-fiber foods such as whole grain cereals and breads, brown rice, beans, and fresh fruits and vegetables every day. These foods may help prevent or relieve constipation.   Resume activities such as climbing stairs,  driving, lifting, exercising, or traveling as directed by your health care provider.   Talk to your health care provider about resuming sexual activities. This is dependent upon your risk of infection, your rate of healing, and your comfort and desire to resume sexual activity.   Try to have someone help you with your household activities and your newborn for at least a few days after you leave the hospital.   Rest as much as possible. Try to rest or take a nap when your newborn is sleeping.   Increase your activities gradually.   Keep all of your scheduled postpartum appointments. It is very important to keep your scheduled follow-up appointments. At these appointments, your health care provider will be checking to make sure that you are healing physically and emotionally.  SEEK MEDICAL CARE IF:    You are passing large clots from your vagina. Save any clots to show your health care provider.   You have a foul smelling discharge from your vagina.   You have trouble urinating.   You are urinating frequently.   You have pain when you urinate.   You have a change in your bowel movements.   You have increasing redness, pain, or swelling near your incision.   You have pus draining from your incision.   Your incision is separating.   You have painful, hard, or reddened breasts.   You have a severe headache.   You have blurred vision or see spots.   You feel sad   or depressed.   You have thoughts of hurting yourself or your newborn.   You have questions about your care, the care of your newborn, or medications.   You are dizzy or light-headed.   You have a rash.   You have pain, redness, or swelling at the site of the removed intravenous access (IV) tube.   You have nausea or vomiting.   You stopped breastfeeding and have not had a menstrual period within 12 weeks of stopping.   You are not breastfeeding and have not had a menstrual period within 12 weeks of delivery.   You have a fever.  SEEK  IMMEDIATE MEDICAL CARE IF:   You have persistent pain.   You have chest pain.   You have shortness of breath.   You faint.   You have leg pain.   You have stomach pain.   Your vaginal bleeding saturates 2 or more sanitary pads in 1 hour.  MAKE SURE YOU:    Understand these instructions.   Will watch your condition.   Will get help right away if you are not doing well or get worse.     This information is not intended to replace advice given to you by your health care provider. Make sure you discuss any questions you have with your health care provider.     Document Released: 12/27/2001 Document Revised: 04/27/2014 Document Reviewed: 12/02/2011  Elsevier Interactive Patient Education 2016 [employer]

## 2015-05-03 NOTE — Anesthesia Procedure Notes (Signed)
Spinal Patient location during procedure: OR End time: 05/03/2015 5:25 PM Staffing Anesthesiologist: Alvin Critchley Resident/CRNA: Jonna Clark Performed by: resident/CRNA  Preanesthetic Checklist Completed: patient identified, site marked, surgical consent, pre-op evaluation, timeout performed, IV checked, risks and benefits discussed and monitors and equipment checked Spinal Block Patient position: sitting Prep: Betadine Patient monitoring: heart rate Approach: midline Location: L4-5 Needle Needle type: Whitacre  Needle gauge: 25 G Needle length: 9 cm Assessment Sensory level: T10

## 2015-05-03 NOTE — Discharge Summary (Signed)
OB Triage Discharge Summary   30 y.o. [id number] @ [redacted]w[redacted]d presenting for labor evaluation.  Fetal status reassuring. EFM showed rNST and q5-82m UCs, VS AF and normal  Triage course: unchanged SVE of FT-1/70/high. Patient also felt that UCs had spaced out, per RN  Labs pending: none RTC as scheduled Disposition: Home  Triage evaluation done remotely: Yes.    Durene Romans MD [facility]  Pager: [phone]

## 2015-05-03 NOTE — Progress Notes (Signed)
Labor precautions and Discharge instructions reviewed with patient who verbalized understanding. Patient discharged home in stable ambulatory condition.

## 2015-05-03 NOTE — Progress Notes (Signed)
  Labor Progress Note   30 y.o. [id number] @ [redacted]w[redacted]d , admitted for labor observation, contractions  Subjective:  Pt thinks water may have broken  Objective:  BP 124/68 mmHg  Pulse 91  Resp 20  LMP 08/02/2014 (Exact Date) Abd: gravid, non tender to palpation.  Extr: No edema, no signs or symptoms of DVT SVE: Dilation 1cm, Effacement 70%, Station -2 Nitrazine positive and puddle of fluid on pad per RN check  EFM: Baseline 150 bpm, moderate variability, + accelerations, - decelerations.  Toco: Q 2-3 minutes, moderate strength to palpation Labs: none   Assessment & Plan:  G3P1011 @ [redacted]w[redacted]d, admitted for  Pregnancy and Labor/Delivery Observation  1. Re-check status of Valinda Party, [place]  05/03/15 1555  This patient and plan were discussed with Dr Kenton Kingfisher 05/03/2015

## 2015-05-04 LAB — CBC
HCT: 30.1 % — ABNORMAL LOW (ref 35.0–47.0)
HEMOGLOBIN: 10.8 g/dL — AB (ref 12.0–16.0)
MCH: 35.4 pg — ABNORMAL HIGH (ref 26.0–34.0)
MCHC: 35.7 g/dL (ref 32.0–36.0)
MCV: 99 fL (ref 80.0–100.0)
PLATELETS: 116 10*3/uL — AB (ref 150–440)
RBC: 3.04 MIL/uL — AB (ref 3.80–5.20)
RDW: 12.8 % (ref 11.5–14.5)
WBC: 12.7 10*3/uL — ABNORMAL HIGH (ref 3.6–11.0)

## 2015-05-04 MED ORDER — NALBUPHINE HCL 10 MG/ML IJ SOLN: 5.0000 mg | Freq: Once | INTRAMUSCULAR | Status: DC | PRN

## 2015-05-04 MED ORDER — MORPHINE SULFATE (PF) 2 MG/ML IV SOLN: 1.0000 mg | INTRAVENOUS | Status: AC | PRN

## 2015-05-04 MED ORDER — ONDANSETRON HCL 4 MG/2ML IJ SOLN: 4.0000 mg | Freq: Three times a day (TID) | INTRAMUSCULAR | Status: DC | PRN

## 2015-05-04 MED ORDER — NALOXONE HCL 2 MG/2ML IJ SOSY: 1.0000 ug/kg/h | PREFILLED_SYRINGE | INTRAVENOUS | Status: DC | PRN

## 2015-05-04 MED ORDER — IBUPROFEN 600 MG PO TABS
600.0000 mg | ORAL_TABLET | Freq: Four times a day (QID) | ORAL | Status: DC | PRN
  Administered 2015-05-04 – 2015-05-06 (×7): 600 mg via ORAL
  Filled 2015-05-04 (×7): qty 1

## 2015-05-04 MED ORDER — DIPHENHYDRAMINE HCL 25 MG PO CAPS: 25.0000 mg | ORAL_CAPSULE | ORAL | Status: DC | PRN

## 2015-05-04 MED ORDER — SODIUM CHLORIDE 0.9 % IJ SOLN: 3.0000 mL | INTRAMUSCULAR | Status: DC | PRN

## 2015-05-04 MED ORDER — NALBUPHINE HCL 10 MG/ML IJ SOLN: 5.0000 mg | INTRAMUSCULAR | Status: DC | PRN

## 2015-05-04 MED ORDER — NALOXONE HCL 0.4 MG/ML IJ SOLN: 0.4000 mg | INTRAMUSCULAR | Status: DC | PRN

## 2015-05-04 MED ORDER — DIPHENHYDRAMINE HCL 50 MG/ML IJ SOLN: 12.5000 mg | INTRAMUSCULAR | Status: DC | PRN

## 2015-05-04 MED ORDER — SCOPOLAMINE 1 MG/3DAYS TD PT72: 1.0000 | MEDICATED_PATCH | Freq: Once | TRANSDERMAL | Status: DC

## 2015-05-04 NOTE — Anesthesia Postprocedure Evaluation (Signed)
Anesthesia Post Note  Patient: Jennessy Charnock  Procedure(s) Performed: Procedure(s) (LRB): CESAREAN SECTION (N/A)  Patient location during evaluation: [facility] Anesthesia Type: Spinal Level of consciousness: awake and alert, awake and oriented Pain management: pain level controlled Vital Signs Assessment: post-procedure vital signs reviewed and stable Respiratory status: spontaneous breathing, nonlabored ventilation and respiratory function stable Cardiovascular status: blood pressure returned to baseline and stable Postop Assessment: no headache, no backache and adequate PO intake Anesthetic complications: no Comments: Doing well with good pain relief    Last Vitals:  Filed Vitals:   05/04/15 0420 05/04/15 0802  BP: 112/68 104/62  Pulse: 80 75  Temp: 37 C 36.9 C  Resp: 18 18    Last Pain:  Filed Vitals:   05/04/15 0824  PainSc: 0-No pain                 Molli Barrows

## 2015-05-04 NOTE — Anesthesia Post-op Follow-up Note (Signed)
  Anesthesia Pain Follow-up Note  Patient: Dawn Gibbs  Day #: 1  Date of Follow-up: 05/04/2015 Time: 11:47 AM  Last Vitals:  Filed Vitals:   05/04/15 0420 05/04/15 0802  BP: 112/68 104/62  Pulse: 80 75  Temp: 37 C 36.9 C  Resp: 18 18    Level of Consciousness: alert  Pain: mild   Side Effects:None  Catheter Site Exam:clean  Plan: Continue current therapy  Molli Barrows

## 2015-05-04 NOTE — Progress Notes (Signed)
Subjective:   "Everything is going well. The first time I got up I was a little lightheaded, but I'm feeling fine now and I was able to pee. I've been eating and drinking and the baby is breastfeeding well"  Objective:  Blood pressure 104/62, pulse 75, temperature 98.4 F (36.9 C), temperature source Tympanic, resp. rate 18, height 5\' 4"  (1.626 m), weight 73.029 kg (161 lb), last menstrual period 08/02/2014, SpO2 98 %, breastfeeding.  General: NAD Pulmonary: no increased work of breathing Abdomen: non-distended, non-tender, fundus firm at level of umbilicus Incision: Dressing is C/D/I Extremities: no edema, no erythema, no tenderness  Results for orders placed or performed during the hospital encounter of 05/03/15 (from the past 24 hour(s))  CBC with Differential/Platelet     Status: Abnormal   Collection Time: 05/03/15  4:18 PM  Result Value Ref Range   WBC 18.0 (H) 3.6 - 11.0 K/uL   RBC 4.17 3.80 - 5.20 MIL/uL   Hemoglobin 14.0 12.0 - 16.0 g/dL   HCT 41.3 35.0 - 47.0 %   MCV 98.9 80.0 - 100.0 fL   MCH 33.6 26.0 - 34.0 pg   MCHC 34.0 32.0 - 36.0 g/dL   RDW 12.9 11.5 - 14.5 %   Platelets 123 (L) 150 - 440 K/uL   Neutrophils Relative % 87 %   Neutro Abs 15.6 (H) 1.4 - 6.5 K/uL   Lymphocytes Relative 7 %   Lymphs Abs 1.3 1.0 - 3.6 K/uL   Monocytes Relative 5 %   Monocytes Absolute 0.9 0.2 - 0.9 K/uL   Eosinophils Relative 0 %   Eosinophils Absolute 0.0 0 - 0.7 K/uL   Basophils Relative 1 %   Basophils Absolute 0.2 (H) 0 - 0.1 K/uL  Rapid HIV screen (HIV 1/2 Ab+Ag) ([facility] Only)     Status: None   Collection Time: 05/03/15  4:18 PM  Result Value Ref Range   HIV-1 P24 Antigen - HIV24 NON REACTIVE NON REACTIVE   HIV 1/2 Antibodies NON REACTIVE NON REACTIVE   Interpretation (HIV Ag Ab)      A non reactive test result means that HIV 1 or HIV 2 antibodies and HIV 1 p24 antigen were not detected in the specimen.  Type and screen     Status: None   Collection Time: 05/03/15  4:35  PM  Result Value Ref Range   ABO/RH(D) O NEG    Antibody Screen POS    Sample Expiration 05/06/2015    Antibody Identification PASSIVELY ACQUIRED ANTI-D   ABO/Rh     Status: None   Collection Time: 05/03/15  4:36 PM  Result Value Ref Range   ABO/RH(D) O NEG   CBC     Status: Abnormal   Collection Time: 05/04/15  4:16 AM  Result Value Ref Range   WBC 12.7 (H) 3.6 - 11.0 K/uL   RBC 3.04 (L) 3.80 - 5.20 MIL/uL   Hemoglobin 10.8 (L) 12.0 - 16.0 g/dL   HCT 30.1 (L) 35.0 - 47.0 %   MCV 99.0 80.0 - 100.0 fL   MCH 35.4 (H) 26.0 - 34.0 pg   MCHC 35.7 32.0 - 36.0 g/dL   RDW 12.8 11.5 - 14.5 %   Platelets 116 (L) 150 - 440 K/uL    Assessment:   30 y.o. [id number] postoperativeday # 1 Stable Progressing WNL   Plan:  1) Acute blood loss anemia - hemodynamically stable and asymptomatic - po ferrous sulfate  2) O neg / Rubella Immune, Varicella Immune  3) TDAP/FLU UTD   4) Breast/Contraception ?  5) Disposition- home PPD 3 or 4  6) Rhogam pending baby blood type

## 2015-05-04 NOTE — Anesthesia Post-op Follow-up Note (Signed)
  Anesthesia Pain Follow-up Note  Patient: Dawn Gibbs  Day #: 1  Date of Follow-up: 05/04/2015 Time: 11:46 AM  Last Vitals:  Filed Vitals:   05/04/15 0420 05/04/15 0802  BP: 112/68 104/62  Pulse: 80 75  Temp: 37 C 36.9 C  Resp: 18 18    Level of Consciousness: alert  Pain: mild   Side Effects:None  Catheter Site Exam:clean, no drainage  Plan: Continue current therapy  Molli Barrows

## 2015-05-05 LAB — CBC
HEMATOCRIT: 30.8 % — AB (ref 35.0–47.0)
HEMOGLOBIN: 10.7 g/dL — AB (ref 12.0–16.0)
MCH: 34.9 pg — ABNORMAL HIGH (ref 26.0–34.0)
MCHC: 34.7 g/dL (ref 32.0–36.0)
MCV: 100.6 fL — AB (ref 80.0–100.0)
Platelets: 133 10*3/uL — ABNORMAL LOW (ref 150–440)
RBC: 3.06 MIL/uL — AB (ref 3.80–5.20)
RDW: 12.9 % (ref 11.5–14.5)
WBC: 10.7 10*3/uL (ref 3.6–11.0)

## 2015-05-05 NOTE — Progress Notes (Signed)
  Subjective:   "I felt sweaty and shaky last night but I'm feeling better now. I've been eating and drinking and the baby is breastfeeding well"  Objective:  BP 113/70 mmHg  Pulse 72  Temp(Src) 98.2 F (36.8 C) (Oral)  Resp 18  Ht 5\' 4"  (1.626 m)  Wt 73.029 kg (161 lb)  BMI 27.62 kg/m2  SpO2 98%  LMP 08/02/2014 (Exact Date)  breastfeeding.  General: NAD Pulmonary: no increased work of breathing Abdomen: non-distended, non-tender, fundus firm at level of umbilicus, On Q pump in place. Incision: Open to air, healing well, no s/s infection Extremities: no edema, no erythema, no tenderness   Assessment:   30 y.o. [id number] postoperativeday # 2 Stable, Tolerating PO, voiding without difficulty Progressing WNL   Plan:  1) Acute blood loss anemia - CBC  - po ferrous sulfate  2) O neg / Rubella Immune, Varicella Immune   3) TDAP/FLU UTD   4) Breastfeeding  5) Disposition- home PPD 3 or 4  6) Rhogam: NA, Baby is Rh negative  7) Contraception: minipill  Dawn Gibbs, CNM   This patient and plan were discussed with Dr Leonides Schanz 05/05/2015

## 2015-05-05 NOTE — Lactation Note (Signed)
This note was copied from the chart of Dawn Gibbs. Lactation Consultation Note  Patient Name: Dawn Gibbs [id number] Date: 05/05/2015 Reason for consult: Initial assessment   Maternal Data Does the patient have breastfeeding experience prior to this delivery?: Yes  Feeding Feeding Type: Breast Fed Length of feed: 20 min  LATCH Score/Interventions  Mom is using a nipple shield. Baby has had a 10% weight loss. Mom doesn't want to supplement with formula, but is not opposed to using either. Suggested that she can also pump and feed expressed milk to baby, but mom declined for now and wants to see what baby's weight is in the morning since her milk just came in.                     Lactation Tools Discussed/Used Tools: Nipple [place] Fuel (not seen by [facility])   Consult Status Consult Status: Follow-up Date: 05/06/15 Follow-up type: In-patient    Marnee Spring 05/05/2015, 9:05 PM

## 2015-05-06 ENCOUNTER — Encounter: Payer: Self-pay | Admitting: Obstetrics and Gynecology

## 2015-05-06 MED ORDER — IBUPROFEN 600 MG PO TABS: 600.0000 mg | ORAL_TABLET | Freq: Four times a day (QID) | ORAL | Status: DC | PRN

## 2015-05-06 MED ORDER — OXYCODONE-ACETAMINOPHEN 5-325 MG PO TABS: 1.0000 | ORAL_TABLET | ORAL | Status: DC | PRN

## 2015-05-06 MED ORDER — NORETHINDRONE 0.35 MG PO TABS: 1.0000 | ORAL_TABLET | Freq: Every day | ORAL | Status: DC

## 2015-05-06 MED ORDER — LANOLIN HYDROUS EX OINT: 1.0000 "application " | TOPICAL_OINTMENT | CUTANEOUS | Status: DC | PRN

## 2015-05-06 NOTE — Discharge Summary (Signed)
Physician Obstetric Discharge Summary  Patient ID: Dawn Gibbs MRN: [id number] DOB/AGE: 31-22-1987 31 y.o.   Date of Admission: 05/03/2015  Date of Discharge: 05/06/2015  Admitting Diagnosis: Scheduled cesarean section at [redacted]w[redacted]d  Secondary Diagnosis: History of third degree laceration desiring primary Cesarean section for delivery  Mode of Delivery: primary cesarean section 05/03/2015  low uterine, transverse     Discharge Diagnosis: No other diagnosis   Intrapartum Procedures: Spinal anesthesia   Post partum procedures: Baby and mother both O negative, Rhogam not indicated  Complications: none   Brief Hospital Course      (Cesarean Section): Dawn Gibbs is a [id number] who underwent cesarean section on 05/03/2015.  Patient had an uncomplicated surgery; for further details of this surgery, please refer to the operative note.  Patient had an uncomplicated postpartum course.  By time of discharge on POD#3, her pain was controlled on oral pain medications and the ON -Q pump; she had appropriate lochia and was ambulating, voiding without difficulty, tolerating regular diet and passing flatus.   She was deemed stable for discharge to home.    Labs: CBC Latest Ref Rng 05/05/2015 05/04/2015 05/03/2015  WBC 3.6 - 11.0 K/uL 10.7 12.7(H) 18.0(H)  Hemoglobin 12.0 - 16.0 g/dL 10.7(L) 10.8(L) 14.0  Hematocrit 35.0 - 47.0 % 30.8(L) 30.1(L) 41.3  Platelets 150 - 440 K/uL 133(L) 116(L) 123(L)   O NEG  Physical exam:  Blood pressure 113/60, pulse 56, temperature 97.6 F (36.4 C), temperature source Oral, resp. rate 18, height 5\' 4"  (1.626 m), weight 73.029 kg (161 lb), last menstrual period 08/02/2014, SpO2 98 %, unknown if currently breastfeeding. General: alert and no distress, reports passing flatus Heart: RRR without murmur Lungs: CTA Lochia: appropriate Abdomen: soft, NT, BS active Uterine Fundus: firm at U-1/ML/NT Incision: healing well, no significant drainage, no dehiscence, no  significant erythema. On Q intact Extremities: No evidence of DVT seen on physical exam. No lower extremity edema.  Discharge Instructions: Per After Visit Summary. Activity: Advance as tolerated. Pelvic rest for 6 weeks.  Also refer to Discharge Instructions Diet: Regular Medications:   Medication List    STOP taking these medications        amoxicillin-clavulanate 875-125 MG tablet  Commonly known as:  AUGMENTIN     benzonatate 100 MG capsule  Commonly known as:  TESSALON      TAKE these medications        ibuprofen 600 MG tablet  Commonly known as:  ADVIL,MOTRIN  Take 1 tablet (600 mg total) by mouth every 6 (six) hours as needed for mild pain.     lanolin Oint  Apply 1 application topically as needed (for breast care).     norethindrone 0.35 MG tablet  Commonly known as:  MICRONOR,CAMILA,ERRIN  Take 1 tablet (0.35 mg total) by mouth daily.  Start taking on:  06/02/2015     oxyCODONE-acetaminophen 5-325 MG tablet  Commonly known as:  PERCOCET/ROXICET  Take 1 tablet by mouth every 4 (four) hours as needed (pain scale 4-7).     Prenatal Vitamins 28-0.8 MG Tabs  Take by mouth.     PULMICORT FLEXHALER 90 MCG/ACT inhaler  Generic drug:  Budesonide       Outpatient follow up:  Follow-up Information    Follow up with Dorthula Nettles, MD In 2 weeks.   Specialty:  Obstetrics and Gynecology   Why:  For Post Op   Contact information:   [address] [place] [address] [phone]  Postpartum contraception: oral progesterone-only contraceptive  Discharged Condition: stable  Discharged to: home   Newborn Data: Disposition:home with mother  Apgars: APGAR (1 MIN): 9   APGAR (5 MINS): 9   APGAR (10 MINS):    Baby Feeding: Breast/ Zoe/ 6#14.4 oz  Dalia Heading, CNM 05/06/2015 9:24 AM

## 2015-05-06 NOTE — Lactation Note (Signed)
Lactation Consultation Note  Patient Name: Dawn Gibbs [id number] Date: 05/06/2015     Maternal Data  using 16 mm nipple shield as she did with first child, difficulty latching to breast because of suck uncoordination  Feeding   11% wt loss today, Mom states this happened with first child and once her milk came in, breasteeding went fine and able to latch without nipple shield.     BABy will latch with nipple shield in place, gets very fussy if pushed to breast, does better with gentle positioning, has poor suck, mostly sucks at front of mouth, pulls in lower lip, chomps, sucks, lets go , no rhythm, only took in 2 cc breastmilk per pre/post wt  After 25 min at breasts, mom pumped 5 cc colostrum with medela symphony pump and this was fed by bottle by father.  Parents instructed to supplement expresses breastmilk and formula to total 20 -30 cc each feeding until seen by Ped. Tomorrow.  Pump breasts every feed or at least every other feeding to increase milk production and provide supplement.  Mom thinks her milk is coming in.   [facility] Score/Interventions                      Lactation Tools Discussed/Used     Consult Status      Ferol Luz 05/06/2015, 3:17 PM

## 2015-05-07 ENCOUNTER — Inpatient Hospital Stay: Payer: BLUE CROSS/BLUE SHIELD | Admitting: Oncology

## 2015-05-07 ENCOUNTER — Inpatient Hospital Stay: Payer: BLUE CROSS/BLUE SHIELD | Attending: Oncology

## 2015-05-08 ENCOUNTER — Inpatient Hospital Stay: Admission: RE | Admit: 2015-05-08 | Payer: BLUE CROSS/BLUE SHIELD | Source: Ambulatory Visit

## 2015-05-09 ENCOUNTER — Encounter: Admission: RE | Payer: Self-pay | Source: Ambulatory Visit

## 2015-05-09 SURGERY — Surgical Case
Anesthesia: Choice

## 2016-07-13 ENCOUNTER — Telehealth: Payer: Self-pay | Admitting: Certified Nurse Midwife

## 2016-07-13 ENCOUNTER — Other Ambulatory Visit: Payer: Self-pay | Admitting: Certified Nurse Midwife

## 2016-07-13 MED ORDER — NORETHINDRONE 0.35 MG PO TABS: 1.0000 | ORAL_TABLET | Freq: Every day | ORAL | 1 refills | Status: DC

## 2016-07-13 NOTE — Telephone Encounter (Signed)
Verified pt is still breastfeeding. Sent in rx with one additional refill.

## 2016-07-13 NOTE — Telephone Encounter (Signed)
Left msg for pt. Needs appt for refills and need to know if pt is still breastfeeding

## 2016-07-13 NOTE — Telephone Encounter (Signed)
Pt is schedule for her Annual With Dr. Georgianne Fick for 09/02/16 at 3:30

## 2016-07-13 NOTE — Telephone Encounter (Signed)
Pt is calling back to speak with [facility]. Would like a call back.

## 2016-07-13 NOTE — Telephone Encounter (Signed)
Schedule appt and let me know date. Thanks!

## 2016-08-03 ENCOUNTER — Telehealth: Payer: Self-pay | Admitting: Obstetrics and Gynecology

## 2016-08-03 ENCOUNTER — Telehealth: Payer: Self-pay

## 2016-08-03 NOTE — Telephone Encounter (Signed)
Opened in error

## 2016-08-03 NOTE — Telephone Encounter (Signed)
Pt calling - has annual later today but is on day 2 of period.  She wanted to know if that would interfere with anything.  Adv to keep appt.as it's easier to resch for pap only that for annual.  Her last pap was 2016 so I told her a pap probably would not be done.

## 2016-09-02 ENCOUNTER — Ambulatory Visit: Payer: Self-pay | Admitting: Obstetrics and Gynecology

## 2016-09-07 ENCOUNTER — Ambulatory Visit (INDEPENDENT_AMBULATORY_CARE_PROVIDER_SITE_OTHER): Payer: BLUE CROSS/BLUE SHIELD | Admitting: Obstetrics and Gynecology

## 2016-09-07 ENCOUNTER — Encounter: Payer: Self-pay | Admitting: Obstetrics and Gynecology

## 2016-09-07 DIAGNOSIS — Z01419 Encounter for gynecological examination (general) (routine) without abnormal findings: Secondary | ICD-10-CM | POA: Diagnosis not present

## 2016-09-07 DIAGNOSIS — K649 Unspecified hemorrhoids: Secondary | ICD-10-CM

## 2016-09-07 MED ORDER — HYDROCORTISONE ACE-PRAMOXINE 2.5-1 % RE CREA: 1.0000 "application " | TOPICAL_CREAM | Freq: Three times a day (TID) | RECTAL | 0 refills | Status: DC

## 2016-09-07 NOTE — Progress Notes (Signed)
Patient ID: Dawn Gibbs, female   DOB: 12-26-1985, 31 y.o.   MRN: [id number]     Gynecology Annual Exam  PCP: Malachy Mood, MD  Chief Complaint: No chief complaint on file.   History of Present Illness: Patient is a 31 y.o. G3P2011 presents for annual exam. The patient has no complaints today.   LMP: Patient's last menstrual period was 08/31/2016 (exact date). Average Interval: regular, 28 days Duration of flow: 5 days Heavy Menses: no Clots: no Intermenstrual Bleeding: no Postcoital Bleeding: no Dysmenorrhea: no  The patient is sexually active. She currently uses progesterone only OCP for contraception. She denies dyspareunia.  The patient does perform self breast exams.  There is no notable family history of breast or ovarian cancer in her family.  The patient wears seatbelts: no.   The patient has regular exercise: no.    The patient denies current symptoms of depression.    Review of Systems: Review of Systems  Constitutional: Negative for chills and fever.  HENT: Negative for congestion.   Respiratory: Negative for cough and shortness of breath.   Cardiovascular: Negative for chest pain and palpitations.  Gastrointestinal: Negative for abdominal pain, constipation, diarrhea, heartburn, nausea and vomiting.  Genitourinary: Negative for dysuria, frequency and urgency.  Skin: Negative for itching and rash.  Neurological: Negative for dizziness and headaches.  Endo/Heme/Allergies: Negative for polydipsia.  Psychiatric/Behavioral: Negative for depression.    Past Medical History:  Past Medical History:  Diagnosis Date  . Medical history non-contributory   . Platelets decreased ([place])   . PONV (postoperative nausea and vomiting)     Past Surgical History:  Past Surgical History:  Procedure Laterality Date  . CESAREAN SECTION N/A 05/03/2015   Procedure: CESAREAN SECTION;  Surgeon: Malachy Mood, MD;  Location: [facility] ORS;  Service: Obstetrics;  Laterality:  N/A;  . RETINAL TEAR REPAIR CRYOTHERAPY Right 2008  . TONSILLECTOMY      Gynecologic History:  Patient's last menstrual period was 08/31/2016 (exact date). Contraception: oral progesterone-only contraceptive Last Pap: Results were: NIL and HR HPV negative 07/06/2014  Obstetric History: [id number]  Family History:  Family History  Problem Relation Age of Onset  . Breast cancer Maternal Grandmother     Social History:  Social History   Social History  . Marital status: Married    Spouse name: N/A  . Number of children: N/A  . Years of education: N/A   Occupational History  . Not on file.   Social History Main Topics  . Smoking status: Never Smoker  . Smokeless tobacco: Never Used  . Alcohol use No  . Drug use: No  . Sexual activity: Yes    Birth control/ protection: Pill   Other Topics Concern  . Not on file   Social History Narrative  . No narrative on file    Allergies:  Allergies  Allergen Reactions  . Macrobid [Nitrofurantoin Monohyd Macro] Hives    Medications: Prior to Admission medications   Medication Sig Start Date End Date Taking? Authorizing Provider  norethindrone (MICRONOR,CAMILA,ERRIN) 0.35 MG tablet Take 1 tablet (0.35 mg total) by mouth daily. 07/13/16  Yes Malachy Mood, MD  Prenatal Vit-Fe Fumarate-FA (PRENATAL VITAMINS) 28-0.8 MG TABS Take by mouth.   Yes [provider]    Physical Exam Vitals: Blood pressure 110/68, pulse 87, height 5\' 4"  (1.626 m), weight 136 lb (61.7 kg), last menstrual period 08/31/2016, currently breastfeeding.  General: NAD HEENT: normocephalic, anicteric Thyroid: no enlargement, no palpable nodules Pulmonary: No increased work  of breathing, CTAB Cardiovascular: RRR, distal pulses 2+ Breast: Breast symmetrical, no tenderness, no palpable nodules or masses, no skin or nipple retraction present, no nipple discharge.  No axillary or supraclavicular lymphadenopathy. Abdomen: NABS, soft, non-tender,  non-distended.  Umbilicus without lesions.  No hepatomegaly, splenomegaly or masses palpable. No evidence of hernia  Genitourinary:  External: Normal external female genitalia.  Normal urethral meatus, normal  Bartholin's and Skene's glands.    Vagina: Normal vaginal mucosa, no evidence of prolapse.    Cervix: Grossly normal in appearance, no bleeding  Uterus: Non-enlarged, mobile, normal contour.  No CMT  Adnexa: ovaries non-enlarged, no adnexal masses  Rectal: deferred  Lymphatic: no evidence of inguinal lymphadenopathy Extremities: no edema, erythema, or tenderness Neurologic: Grossly intact Psychiatric: mood appropriate, affect full  Female chaperone present for pelvic and breast  portions of the physical exam    Assessment: 31 y.o. G3P2011 routine annual exam  Plan: Problem List Items Addressed This Visit    None    Visit Diagnoses    Encounter for gynecological examination without abnormal finding       Hemorrhoids, unspecified hemorrhoid type          1) STI screening was offered and declined  2) ASCCP guidelines and rational discussed.  Patient opts for every 3 years screening interval  3) Contraception - continue progestin only OCP, still breast feeding, we discussed switch to combination OCP once cessation of breastfeeding.  We discussed using back for for contraception  4) Routine healthcare maintenance including cholesterol, diabetes screening discussed managed by PCP  5) Hemorrhoids - rx provided  6) Follow up 1 year for routine annual exam

## 2016-09-07 NOTE — Patient Instructions (Signed)
Preventive Care 18-39 Years, Female Preventive care refers to lifestyle choices and visits with your health care provider that can promote health and wellness. What does preventive care include?  A yearly physical exam. This is also called an annual well check.  Dental exams once or twice a year.  Routine eye exams. Ask your health care provider how often you should have your eyes checked.  Personal lifestyle choices, including:  Daily care of your teeth and gums.  Regular physical activity.  Eating a healthy diet.  Avoiding tobacco and drug use.  Limiting alcohol use.  Practicing safe sex.  Taking vitamin and mineral supplements as recommended by your health care provider. What happens during an annual well check? The services and screenings done by your health care provider during your annual well check will depend on your age, overall health, lifestyle risk factors, and family history of disease. Counseling  Your health care provider may ask you questions about your:  Alcohol use.  Tobacco use.  Drug use.  Emotional well-being.  Home and relationship well-being.  Sexual activity.  Eating habits.  Work and work environment.  Method of birth control.  Menstrual cycle.  Pregnancy history. Screening  You may have the following tests or measurements:  Height, weight, and BMI.  Diabetes screening. This is done by checking your blood sugar (glucose) after you have not eaten for a while (fasting).  Blood pressure.  Lipid and cholesterol levels. These may be checked every 5 years starting at age 20.  Skin check.  Hepatitis C blood test.  Hepatitis B blood test.  Sexually transmitted disease (STD) testing.  BRCA-related cancer screening. This may be done if you have a family history of breast, ovarian, tubal, or peritoneal cancers.  Pelvic exam and Pap test. This may be done every 3 years starting at age 21. Starting at age 30, this may be done every 5  years if you have a Pap test in combination with an HPV test. Discuss your test results, treatment options, and if necessary, the need for more tests with your health care provider. Vaccines  Your health care provider may recommend certain vaccines, such as:  Influenza vaccine. This is recommended every year.  Tetanus, diphtheria, and acellular pertussis (Tdap, Td) vaccine. You may need a Td booster every 10 years.  Varicella vaccine. You may need this if you have not been vaccinated.  HPV vaccine. If you are 26 or younger, you may need three doses over 6 months.  Measles, mumps, and rubella (MMR) vaccine. You may need at least one dose of MMR. You may also need a second dose.  Pneumococcal 13-valent conjugate (PCV13) vaccine. You may need this if you have certain conditions and were not previously vaccinated.  Pneumococcal polysaccharide (PPSV23) vaccine. You may need one or two doses if you smoke cigarettes or if you have certain conditions.  Meningococcal vaccine. One dose is recommended if you are age 19-21 years and a first-year college student living in a residence hall, or if you have one of several medical conditions. You may also need additional booster doses.  Hepatitis A vaccine. You may need this if you have certain conditions or if you travel or work in places where you may be exposed to hepatitis A.  Hepatitis B vaccine. You may need this if you have certain conditions or if you travel or work in places where you may be exposed to hepatitis B.  Haemophilus influenzae type b (Hib) vaccine. You may need this   if you have certain risk factors. Talk to your health care provider about which screenings and vaccines you need and how often you need them. This information is not intended to replace advice given to you by your health care provider. Make sure you discuss any questions you have with your health care provider. Document Released: 06/02/2001 Document Revised: 12/25/2015  Document Reviewed: 02/05/2015 Elsevier Interactive Patient Education  2017 [employer].

## 2016-09-09 ENCOUNTER — Other Ambulatory Visit: Payer: Self-pay | Admitting: Obstetrics and Gynecology

## 2016-09-09 MED ORDER — HYDROCORTISONE ACE-PRAMOXINE 1-1 % RE CREA: 1.0000 "application " | TOPICAL_CREAM | Freq: Two times a day (BID) | RECTAL | 2 refills | Status: DC

## 2016-11-01 ENCOUNTER — Other Ambulatory Visit: Payer: Self-pay | Admitting: Obstetrics and Gynecology

## 2016-11-02 NOTE — Telephone Encounter (Signed)
Please advise. Pt had her Annual in May

## 2016-11-18 DIAGNOSIS — D0362 Melanoma in situ of left upper limb, including shoulder: Secondary | ICD-10-CM

## 2016-11-18 HISTORY — DX: Melanoma in situ of left upper limb, including shoulder: D03.62

## 2017-02-18 DIAGNOSIS — Z85820 Personal history of malignant melanoma of skin: Secondary | ICD-10-CM | POA: Insufficient documentation

## 2017-03-17 ENCOUNTER — Ambulatory Visit (INDEPENDENT_AMBULATORY_CARE_PROVIDER_SITE_OTHER): Payer: BLUE CROSS/BLUE SHIELD | Admitting: Obstetrics and Gynecology

## 2017-03-17 ENCOUNTER — Encounter: Payer: Self-pay | Admitting: Obstetrics and Gynecology

## 2017-03-17 VITALS — BP 124/70 | Ht 64.0 in | Wt 136.0 lb

## 2017-03-17 DIAGNOSIS — N63 Unspecified lump in unspecified breast: Secondary | ICD-10-CM

## 2017-03-17 DIAGNOSIS — C4362 Malignant melanoma of left upper limb, including shoulder: Secondary | ICD-10-CM

## 2017-03-19 DIAGNOSIS — C4362 Malignant melanoma of left upper limb, including shoulder: Secondary | ICD-10-CM | POA: Insufficient documentation

## 2017-03-19 NOTE — Progress Notes (Signed)
Obstetrics & Gynecology Office Visit   Chief Complaint:  Chief Complaint  Patient presents with  . Breast Pain    History of Present Illness: 31 year old [id number] presenting for evaluation of left breast nodularity noted in the past 2 weeks.  She only recently stopped breast feeding 2 months ago.  She still has some limited galactorrhea.  arm.  The area has stayed stable in size over the past week, it wasn't tender until after she started pressing on it consistently to check it.  No overlying skin changes or rashes noted in association.  There is no family history of breast cancer.  She has not had any prior breast imaging.In addition she recently underwent melanoma excision on her left upper which prompted her to get this evaluated as well.   Review of Systems: Review of Systems  Constitutional: Negative for chills, fever and weight loss.  Cardiovascular: Positive for chest pain.  Skin: Negative for itching and rash.    Past Medical History:  Past Medical History:  Diagnosis Date  . Medical history non-contributory   . Platelets decreased ([place])   . PONV (postoperative nausea and vomiting)     Past Surgical History:  Past Surgical History:  Procedure Laterality Date  . CESAREAN SECTION N/A 05/03/2015   Procedure: CESAREAN SECTION;  Surgeon: Malachy Mood, MD;  Location: [facility] ORS;  Service: Obstetrics;  Laterality: N/A;  . RETINAL TEAR REPAIR CRYOTHERAPY Right 2008  . TONSILLECTOMY      Gynecologic History: Patient's last menstrual period was 03/12/2017.  Obstetric History: [id number]  Family History:  Family History  Problem Relation Age of Onset  . Breast cancer Maternal Grandmother     Social History:  Social History   Socioeconomic History  . Marital status: Married    Spouse name: Not on file  . Number of children: Not on file  . Years of education: Not on file  . Highest education level: Not on file  Social Needs  . Financial resource strain: Not on  file  . Food insecurity - worry: Not on file  . Food insecurity - inability: Not on file  . Transportation needs - medical: Not on file  . Transportation needs - non-medical: Not on file  Occupational History  . Not on file  Tobacco Use  . Smoking status: Never Smoker  . Smokeless tobacco: Never Used  Substance and Sexual Activity  . Alcohol use: No  . Drug use: No  . Sexual activity: Yes    Birth control/protection: Pill  Other Topics Concern  . Not on file  Social History Narrative  . Not on file    Allergies:  Allergies  Allergen Reactions  . Macrobid [Nitrofurantoin Monohyd Macro] Hives    Medications: Prior to Admission medications   Medication Sig Start Date End Date Taking? Authorizing Provider  NORLYDA 0.35 MG tablet TAKE 1 TABLET(0.35 MG) BY MOUTH DAILY 11/03/16  Yes Malachy Mood, MD  Prenatal Vit-Fe Fumarate-FA (PRENATAL VITAMINS) 28-0.8 MG TABS Take by mouth.   Yes [provider]  pramoxine-hydrocortisone [facility]) 1-1 % rectal cream Place 1 application rectally 2 (two) times daily. Patient not taking: Reported on 03/17/2017 09/09/16   Malachy Mood, MD    Physical Exam Vitals:  Vitals:   03/17/17 1440  BP: 124/70   Patient's last menstrual period was 03/12/2017.  General: NAD HEENT: normocephalic, anicteric Breast symmetrical, no tenderness, there are several small subcentimeter nodules in left breast that are well circumscribed and mobile, no  skin or nipple retraction present, no nipple discharge.  No axillary or supraclavicular lymphadenopathy. Pulmonary: No increased work of breathingpathy Extremities: no edema, erythema, or tenderness , well healing wide local incision site outside of left upper arm Neurologic: Grossly intact Psychiatric: mood appropriate, affect full  Female chaperone present breast  portions of the physical exam  Assessment: 31 y.o. 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  However, should there be any change is size or symptoms on re=exam would recommend imaging and referral to general surgery - A total of 15 minutes were spent in face-to-face contact with the patient during this encounter with over half of that time devoted to counseling and coordination of care.

## 2017-04-06 ENCOUNTER — Ambulatory Visit
Admission: RE | Admit: 2017-04-06 | Discharge: 2017-04-06 | Disposition: A | Discharge status: Home / Self Care | Payer: BLUE CROSS/BLUE SHIELD | Source: Ambulatory Visit | Attending: Otolaryngology | Admitting: Otolaryngology

## 2017-04-06 ENCOUNTER — Other Ambulatory Visit: Payer: Self-pay | Admitting: Otolaryngology

## 2017-04-06 DIAGNOSIS — R221 Localized swelling, mass and lump, neck: Secondary | ICD-10-CM | POA: Diagnosis not present

## 2017-04-08 ENCOUNTER — Ambulatory Visit: Payer: BLUE CROSS/BLUE SHIELD

## 2017-04-29 ENCOUNTER — Encounter: Payer: Self-pay | Admitting: Obstetrics and Gynecology

## 2017-04-29 ENCOUNTER — Ambulatory Visit (INDEPENDENT_AMBULATORY_CARE_PROVIDER_SITE_OTHER): Payer: BLUE CROSS/BLUE SHIELD | Admitting: Obstetrics and Gynecology

## 2017-04-29 VITALS — BP 112/66 | HR 94 | Ht 64.0 in | Wt 133.0 lb

## 2017-04-29 DIAGNOSIS — Z30011 Encounter for initial prescription of contraceptive pills: Secondary | ICD-10-CM | POA: Diagnosis not present

## 2017-04-29 DIAGNOSIS — Z1239 Encounter for other screening for malignant neoplasm of breast: Secondary | ICD-10-CM

## 2017-04-29 DIAGNOSIS — Z1231 Encounter for screening mammogram for malignant neoplasm of breast: Secondary | ICD-10-CM | POA: Diagnosis not present

## 2017-04-29 MED ORDER — NORETHIN-ETH ESTRAD-FE BIPHAS 1 MG-10 MCG / 10 MCG PO TABS: 1.0000 | ORAL_TABLET | Freq: Every day | ORAL | 3 refills | Status: DC

## 2017-04-29 NOTE — Progress Notes (Signed)
Obstetrics & Gynecology Office Visit   Chief Complaint:  Chief Complaint  Patient presents with  . recheck breast    Fibrocystic changes  . Contraception    switch ocp from mini pill    History of Present Illness: 32 year old G3P2011 presenting for follow up breast exam.  She did have a melanoma excised on her left upper arm in the past year has been slightly more vigilant about her body since that time.  She presented about 6 weeks ago for evaluation of tenderness in left breast with some perceived nodularity at that time.  No abnormalities were palpated and the areas of concern were deemed to most likely represent fibrocystic changes.  She presents today for interval follow up exam to document stability and to see if she has noted any changes.  She has stopped breast feeding, denies any continued galactorrhea.  She has not noted any change in size for the areas of concern.  No increased tenderness, no overlying skin changes.  Family history is negative for breast and or ovarian cancer.  She os currently still only on progestin mini-pill for contraception and would like to switch to a regular pill..     Review of Systems: Review of Systems  Constitutional: Negative for chills, fever and weight loss.  Cardiovascular: Negative for chest pain.  Skin: Negative for rash.     Past Medical History:  Past Medical History:  Diagnosis Date  . Medical history non-contributory   . Platelets decreased ([place])   . PONV (postoperative nausea and vomiting)     Past Surgical History:  Past Surgical History:  Procedure Laterality Date  . CESAREAN SECTION N/A 05/03/2015   Procedure: CESAREAN SECTION;  Surgeon: Malachy Mood, MD;  Location: [facility] ORS;  Service: Obstetrics;  Laterality: N/A;  . RETINAL TEAR REPAIR CRYOTHERAPY Right 2008  . TONSILLECTOMY      Gynecologic History: Patient's last menstrual period was 04/12/2017 (exact date).  Obstetric History: [id number]  Family History:    Family History  Problem Relation Age of Onset  . Breast cancer Maternal Grandmother     Social History:  Social History   Socioeconomic History  . Marital status: Married    Spouse name: Not on file  . Number of children: Not on file  . Years of education: Not on file  . Highest education level: Not on file  Social Needs  . Financial resource strain: Not on file  . Food insecurity - worry: Not on file  . Food insecurity - inability: Not on file  . Transportation needs - medical: Not on file  . Transportation needs - non-medical: Not on file  Occupational History  . Not on file  Tobacco Use  . Smoking status: Never Smoker  . Smokeless tobacco: Never Used  Substance and Sexual Activity  . Alcohol use: No  . Drug use: No  . Sexual activity: Yes    Birth control/protection: Pill  Other Topics Concern  . Not on file  Social History Narrative  . Not on file    Allergies:  Allergies  Allergen Reactions  . Macrobid [Nitrofurantoin Monohyd Macro] Hives    Medications: Prior to Admission medications   Medication Sig Start Date End Date Taking? Authorizing Provider  Norethindrone-Ethinyl Estradiol-Fe Biphas (LO LOESTRIN FE) 1 MG-10 MCG / 10 MCG tablet Take 1 tablet by mouth daily. 04/29/17 07/22/17  Malachy Mood, MD  NORLYDA 0.35 MG tablet TAKE 1 TABLET(0.35 MG) BY MOUTH DAILY 11/03/16  Malachy Mood, MD  pramoxine-hydrocortisone [facility]) 1-1 % rectal cream Place 1 application rectally 2 (two) times daily. Patient not taking: Reported on 03/17/2017 09/09/16   Malachy Mood, MD  Prenatal Vit-Fe Fumarate-FA (PRENATAL VITAMINS) 28-0.8 MG TABS Take by mouth.    [provider]    Physical Exam Vitals:  Vitals:   04/29/17 0941  BP: 112/66  Pulse: 94   Patient's last menstrual period was 04/12/2017 (exact date).  General: NAD HEENT: normocephalic, anicteric Breast symmetrical, no tenderness, no palpable nodules or masses, no skin or nipple  retraction present, no nipple discharge.  No axillary or supraclavicular lymphadenopathy. Pulmonary: No increased work of breathing Extremities: no edema, erythema, or tenderness Neurologic: Grossly intact Psychiatric: mood appropriate, affect full  Female chaperone present for breast  portions of the physical exam  Assessment: 32 y.o. G3P2011 follow up exam left breast nodularity noted by patient  Plan: Problem List Items Addressed This Visit    None    Visit Diagnoses    Encounter for initial prescription of contraceptive pills    -  Primary   Screening breast examination          1) Breast nodularity - No changes noted on exam, benign breast tissue appreciated.  The areas of patient concern feel like small fibrocystic changes.  She stopped breast feeding about 4 months ago.  No nipple discharge.  She has not had other symptoms develop in the interim.  As previously noted family history if negative for breast or ovarian cancer - I did offer the patient ultrasound imaging if she would like further reassurance.  At present she is ok with plan to monitor, her annual is due in 5 months.  2) Patient plans combined OCPs for contraception now that she has stopped breast feeding. Pros and cons and systemic estrogen discussed with patient. She is not breast feeding, nor does she have any other medical contra-indications to estrogen use as listed in WHO guidelines for contraceptive use.  While effective at preventing pregnancy combination oral contraceptive pills do not prevent transmission of sexually transmitted diseases and use of barrier methods for this purpose was discussed.  - Rx lo loestrin FE   3) Return in about 5 months (around 09/27/2017) for annual.

## 2017-05-10 ENCOUNTER — Ambulatory Visit
Admission: EM | Admit: 2017-05-10 | Discharge: 2017-05-10 | Disposition: A | Discharge status: Home / Self Care | Payer: BLUE CROSS/BLUE SHIELD | Attending: Emergency Medicine | Admitting: Emergency Medicine

## 2017-05-10 ENCOUNTER — Other Ambulatory Visit: Payer: Self-pay

## 2017-05-10 ENCOUNTER — Encounter: Payer: Self-pay | Admitting: *Deleted

## 2017-05-10 DIAGNOSIS — H6591 Unspecified nonsuppurative otitis media, right ear: Secondary | ICD-10-CM

## 2017-05-10 DIAGNOSIS — J069 Acute upper respiratory infection, unspecified: Secondary | ICD-10-CM

## 2017-05-10 DIAGNOSIS — H6501 Acute serous otitis media, right ear: Secondary | ICD-10-CM | POA: Diagnosis not present

## 2017-05-10 DIAGNOSIS — H66002 Acute suppurative otitis media without spontaneous rupture of ear drum, left ear: Secondary | ICD-10-CM | POA: Diagnosis not present

## 2017-05-10 MED ORDER — AMOXICILLIN-POT CLAVULANATE 875-125 MG PO TABS: 1.0000 | ORAL_TABLET | Freq: Two times a day (BID) | ORAL | 0 refills | Status: DC

## 2017-05-10 MED ORDER — FLUTICASONE PROPIONATE 50 MCG/ACT NA SUSP: 1.0000 | Freq: Every day | NASAL | 2 refills | Status: DC

## 2017-05-10 NOTE — ED Triage Notes (Signed)
Patient started having left ear pain yesterday and was unable to sleep through the night. Patient reports cold like symptoms for one week.

## 2017-05-10 NOTE — ED Provider Notes (Signed)
[facility]    CSN: [id number] Arrival date & time: 05/10/17  0803     History   Chief Complaint Chief Complaint  Patient presents with  . Otalgia     HPI Dawn Gibbs is a 32 y.o. female.   Patient is a 32 year old female who presents with complaint of left ear pain and drainage.  Patient states the pain started yesterday that was so bad overnight she is having trouble sleeping.  Patient also reports cough, cold, congestion symptoms for about a week.  She reports that left ear also feels closed up with a bulge or pressure.  Patient reports right ear feels okay but has had some in the crackling sound with swallowing.  She has taken some Sudafed and ibuprofen.  She denies fever, chills but does report occasional cough with production of yellow mucus.  Patient also denies headache, chest pain, shortness of breath abdominal pain, nausea and vomiting.      Past Medical History:  Diagnosis Date  . Medical history non-contributory   . Platelets decreased ([place])   . PONV (postoperative nausea and vomiting)     Patient Active Problem List   Diagnosis Date Noted  . Melanoma of left upper arm ([place]) 03/19/2017  . S/P cesarean section 05/03/2015    Past Surgical History:  Procedure Laterality Date  . CESAREAN SECTION N/A 05/03/2015   Procedure: CESAREAN SECTION;  Surgeon: Malachy Mood, MD;  Location: [facility] ORS;  Service: Obstetrics;  Laterality: N/A;  . RETINAL TEAR REPAIR CRYOTHERAPY Right 2008  . TONSILLECTOMY      OB History    Gravida Para Term Preterm AB Living   3 2 2   1 1    SAB TAB Ectopic Multiple Live Births   1     0 1       Home Medications    Prior to Admission medications   Medication Sig Start Date End Date Taking? Authorizing Provider  Norethindrone-Ethinyl Estradiol-Fe Biphas (LO LOESTRIN FE) 1 MG-10 MCG / 10 MCG tablet Take 1 tablet by mouth daily. 04/29/17 07/22/17 Yes Malachy Mood, MD  norgestrel-ethinyl estradiol  (LO/OVRAL,CRYSELLE) 0.3-30 MG-MCG tablet Take 1 tablet by mouth daily.   Yes [provider]  amoxicillin-clavulanate (AUGMENTIN) 875-125 MG tablet Take 1 tablet by mouth every 12 (twelve) hours. 05/10/17   Luvenia Redden, PA-C  fluticasone (FLONASE) 50 MCG/ACT nasal spray Place 1 spray into both nostrils daily. 05/10/17   Luvenia Redden, PA-C    Family History Family History  Problem Relation Age of Onset  . Healthy Mother   . Healthy Father   . Breast cancer Maternal Grandmother     Social History Social History   Tobacco Use  . Smoking status: Never Smoker  . Smokeless tobacco: Never Used  Substance Use Topics  . Alcohol use: No  . Drug use: No     Allergies   Macrobid [nitrofurantoin monohyd macro]   Review of Systems Review of Systems  As noted in HPI.  Systems reviewed and found to be negative.   Physical Exam Triage Vital Signs ED Triage Vitals  Enc Vitals Group     BP 05/10/17 0812 135/63     Pulse Rate 05/10/17 0812 (!) 103     Resp 05/10/17 0812 16     Temp 05/10/17 0812 98.9 F (37.2 C)     Temp Source 05/10/17 0812 Oral     SpO2 05/10/17 0812 100 %     Weight --  Height --      Head Circumference --      Peak Flow --      Pain Score 05/10/17 0813 5     Pain Loc --      Pain Edu? --      Excl. in [place]? --    No data found.  Updated Vital Signs BP 135/63 (BP Location: Left Arm)   Pulse (!) 103   Temp 98.9 F (37.2 C) (Oral)   Resp 16   LMP 04/12/2017   SpO2 100%   Physical Exam  Constitutional: She appears well-developed and well-nourished. No distress.  HENT:  Head: Normocephalic and atraumatic.  Right Ear: External ear and ear canal normal. A middle ear effusion is present.  Left Ear: External ear normal. There is drainage. No mastoid tenderness. A middle ear effusion is present.  Erythema to distal left ear canal.  Right middle ear effusion with some drainage noted in the ear canal.  Mild tenderness to pinna  manipulation.  Unable to fully appreciate any possible membrane perforation.  Eyes: Pupils are equal, round, and reactive to light.     UC Treatments / Results  Labs (all labs ordered are listed, but only abnormal results are displayed) Labs Reviewed - No data to display  EKG  EKG Interpretation None       Radiology No results found.  Procedures Procedures (including critical care time)  Medications Ordered in UC Medications - No data to display   Initial Impression / Assessment and Plan / UC Course  I have reviewed the triage vital signs and the nursing notes.  Pertinent labs & imaging results that were available during my care of the patient were reviewed by me and considered in my medical decision making (see chart for details).    Erythema to left ear canal with white middle ear effusion.  Some drainage noted in the distal canal.  Right ear with clear effusion.  Final Clinical Impressions(s) / UC Diagnoses   Final diagnoses:  Acute suppurative otitis media of left ear without spontaneous rupture of tympanic membrane, recurrence not specified  Middle ear effusion, right  Upper respiratory tract infection, unspecified type    ED Discharge Orders        Ordered    amoxicillin-clavulanate (AUGMENTIN) 875-125 MG tablet  Every 12 hours     05/10/17 0834    fluticasone (FLONASE) 50 MCG/ACT nasal spray  Daily     05/10/17 0834     Will treat patient with prescription for Augmentin for her ear infection and give her Flonase to help improve the drainage.  We will hold off on antibiotic eardrops due to possible TM perforation.  Controlled Substance Prescriptions [place] Controlled Substance Registry consulted? Not Applicable   Luvenia Redden, PA-C 05/10/17 1408

## 2017-05-10 NOTE — Discharge Instructions (Signed)
-  Augmentin: one tablet twice a day for 7 days -Ibuprofen or Tylenol for pain -Recommend Flonase to both naris with spray slightly directed towards the ears -Push fluids -Over-the-counter medication for congestion symptoms like Singulair, Claritin, Zyrtec -Return to clinic if symptoms worsen or do not improve.

## 2017-05-13 ENCOUNTER — Telehealth: Payer: Self-pay

## 2017-05-13 NOTE — Telephone Encounter (Signed)
Called to follow up with patient since visit here at [facility]. Spoke with pt, improved. Patient instructed to call back with any questions or concerns. [facility]

## 2017-08-09 DIAGNOSIS — L729 Follicular cyst of the skin and subcutaneous tissue, unspecified: Secondary | ICD-10-CM | POA: Diagnosis not present

## 2017-08-27 DIAGNOSIS — D1801 Hemangioma of skin and subcutaneous tissue: Secondary | ICD-10-CM | POA: Diagnosis not present

## 2017-08-27 DIAGNOSIS — D2270 Melanocytic nevi of unspecified lower limb, including hip: Secondary | ICD-10-CM | POA: Diagnosis not present

## 2017-08-27 DIAGNOSIS — L814 Other melanin hyperpigmentation: Secondary | ICD-10-CM | POA: Diagnosis not present

## 2017-08-30 ENCOUNTER — Other Ambulatory Visit: Payer: Self-pay | Admitting: Family Medicine

## 2017-08-30 DIAGNOSIS — M7989 Other specified soft tissue disorders: Secondary | ICD-10-CM

## 2017-08-31 ENCOUNTER — Ambulatory Visit
Admission: RE | Admit: 2017-08-31 | Discharge: 2017-08-31 | Disposition: A | Discharge status: Home / Self Care | Payer: 59 | Source: Ambulatory Visit | Attending: Family Medicine | Admitting: Family Medicine

## 2017-08-31 DIAGNOSIS — I8392 Asymptomatic varicose veins of left lower extremity: Secondary | ICD-10-CM | POA: Diagnosis not present

## 2017-08-31 DIAGNOSIS — M799 Soft tissue disorder, unspecified: Secondary | ICD-10-CM | POA: Diagnosis not present

## 2017-08-31 DIAGNOSIS — M7989 Other specified soft tissue disorders: Secondary | ICD-10-CM

## 2017-09-26 DIAGNOSIS — J019 Acute sinusitis, unspecified: Secondary | ICD-10-CM | POA: Diagnosis not present

## 2017-09-28 ENCOUNTER — Encounter: Payer: Self-pay | Admitting: Obstetrics and Gynecology

## 2017-09-28 ENCOUNTER — Ambulatory Visit (INDEPENDENT_AMBULATORY_CARE_PROVIDER_SITE_OTHER): Payer: 59 | Admitting: Obstetrics and Gynecology

## 2017-09-28 VITALS — BP 128/76 | Ht 64.0 in | Wt 132.0 lb

## 2017-09-28 DIAGNOSIS — Z01419 Encounter for gynecological examination (general) (routine) without abnormal findings: Secondary | ICD-10-CM | POA: Diagnosis not present

## 2017-09-28 DIAGNOSIS — Z1231 Encounter for screening mammogram for malignant neoplasm of breast: Secondary | ICD-10-CM | POA: Diagnosis not present

## 2017-09-28 DIAGNOSIS — Z124 Encounter for screening for malignant neoplasm of cervix: Secondary | ICD-10-CM

## 2017-09-28 DIAGNOSIS — Z1239 Encounter for other screening for malignant neoplasm of breast: Secondary | ICD-10-CM

## 2017-09-28 MED ORDER — FLUCONAZOLE 150 MG PO TABS: 150.0000 mg | ORAL_TABLET | Freq: Once | ORAL | 0 refills | Status: AC

## 2017-09-28 MED ORDER — NORETHIN-ETH ESTRAD-FE BIPHAS 1 MG-10 MCG / 10 MCG PO TABS: 1.0000 | ORAL_TABLET | Freq: Every day | ORAL | 3 refills | Status: DC

## 2017-09-28 NOTE — Progress Notes (Signed)
Gynecology Annual Exam   PCP: Dion Body, MD  Chief Complaint:  Chief Complaint  Patient presents with  . Gynecologic Exam    discuss cycles on OCP    History of Present Illness: Patient is a 32 y.o. G3P2011 presents for annual exam. The patient has no complaints today.   LMP: No LMP recorded. (Menstrual status: Oral contraceptives). Average Interval: N/A Duration of flow: absent on Lo Loestrin Heavy Menses: no Clots: no Intermenstrual Bleeding: no Postcoital Bleeding: no Dysmenorrhea: no  The patient is sexually active. She currently uses OCP (estrogen/progesterone) for contraception. She denies dyspareunia.  The patient does perform self breast exams.  There is notable family history of breast or ovarian cancer in her family (Grandmother DCIS age 44).  The patient wears seatbelts: yes.   The patient has regular exercise: not asked.    The patient denies current symptoms of depression.    Review of Systems: Review of Systems  Constitutional: Negative for chills and fever.  HENT: Positive for congestion.   Respiratory: Positive for cough. Negative for shortness of breath.   Cardiovascular: Negative for chest pain and palpitations.  Gastrointestinal: Negative for abdominal pain, constipation, diarrhea, heartburn, nausea and vomiting.  Genitourinary: Negative for dysuria, frequency and urgency.  Skin: Negative for itching and rash.  Neurological: Negative for dizziness and headaches.  Endo/Heme/Allergies: Negative for polydipsia.  Psychiatric/Behavioral: Negative for depression.    Past Medical History:  Past Medical History:  Diagnosis Date  . Medical history non-contributory   . 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 Platelets decreased ([place])   . PONV (postoperative nausea and vomiting)     Past Surgical History:  Past Surgical History:  Procedure Laterality Date  . CESAREAN SECTION N/A 05/03/2015   Procedure: CESAREAN SECTION;   Surgeon: Malachy Mood, MD;  Location: [facility] ORS;  Service: Obstetrics;  Laterality: N/A;  . RETINAL TEAR REPAIR CRYOTHERAPY Right 2008  . TONSILLECTOMY      Gynecologic History:  No LMP recorded. (Menstrual status: Oral contraceptives). Contraception: OCP (estrogen/progesterone) Last Pap: Results were:07/06/2014 NIL and HR HPV negative   Obstetric History: [id number]  Family History:  Family History  Problem Relation Age of Onset  . Healthy Mother   . Healthy Father   . Breast cancer Maternal Grandmother     Social History:  Social History   Socioeconomic History  . Marital status: Married    Spouse name: Not on file  . Number of children: Not on file  . Years of education: Not on file  . Highest education level: Not on file  Occupational History  . Not on file  Social Needs  . Financial resource strain: Not on file  . Food insecurity:    Worry: Not on file    Inability: Not on file  . Transportation needs:    Medical: Not on file    Non-medical: Not on file  Tobacco Use  . Smoking status: Never Smoker  . Smokeless tobacco: Never Used  Substance and Sexual Activity  . Alcohol use: No  . Drug use: No  . Sexual activity: Yes    Birth control/protection: Pill  Lifestyle  . Physical activity:    Days per week: 5 days    Minutes per session: 30 min  . Stress: Only a little  Relationships  . Social connections:    Talks on phone: Not on file    Gets together: Not on file    Attends religious service:  Not on file    Active member of club or organization: Not on file    Attends meetings of clubs or organizations: Not on file    Relationship status: Not on file  . Intimate partner violence:    Fear of current or ex partner: Not on file    Emotionally abused: Not on file    Physically abused: Not on file    Forced sexual activity: Not on file  Other Topics Concern  . Not on file  Social History Narrative  . Not on file    Allergies:  Allergies  Allergen  Reactions  . Macrobid [Nitrofurantoin Monohyd Macro] Hives    Medications: Prior to Admission medications   Medication Sig Start Date End Date Taking? Authorizing Provider  amoxicillin-clavulanate (AUGMENTIN) 875-125 MG tablet Take 1 tablet by mouth every 12 (twelve) hours. 05/10/17   Luvenia Redden, PA-C  fluticasone (FLONASE) 50 MCG/ACT nasal spray Place 1 spray into both nostrils daily. 05/10/17   Luvenia Redden, PA-C  Norethindrone-Ethinyl Estradiol-Fe Biphas (LO LOESTRIN FE) 1 MG-10 MCG / 10 MCG tablet Take 1 tablet by mouth daily. 04/29/17 07/22/17  Malachy Mood, MD  norgestrel-ethinyl estradiol (LO/OVRAL,CRYSELLE) 0.3-30 MG-MCG tablet Take 1 tablet by mouth daily.    [provider]    Physical Exam Vitals: There were no vitals taken for this visit.  General: NAD HEENT: normocephalic, anicteric Thyroid: no enlargement, no palpable nodules Pulmonary: No increased work of breathing, CTAB Cardiovascular: RRR, distal pulses 2+ Breast: Breast symmetrical, no tenderness, no palpable nodules or masses, no skin or nipple retraction present, no nipple discharge.  No axillary or supraclavicular lymphadenopathy. Abdomen: NABS, soft, non-tender, non-distended.  Umbilicus without lesions.  No hepatomegaly, splenomegaly or masses palpable. No evidence of hernia  Genitourinary:  External: Normal external female genitalia.  Normal urethral meatus, normal Bartholin's and Skene's glands.    Vagina: Normal vaginal mucosa, no evidence of prolapse.    Cervix: Grossly normal in appearance, no bleeding  Uterus: Non-enlarged, mobile, normal contour.  No CMT  Adnexa: ovaries non-enlarged, no adnexal masses  Rectal: deferred  Lymphatic: no evidence of inguinal lymphadenopathy Extremities: no edema, erythema, or tenderness Neurologic: Grossly intact Psychiatric: mood appropriate, affect full  Female chaperone present for pelvic and breast  portions of the physical  exam    Assessment: 32 y.o. G3P2011 routine annual exam  Plan: Problem List Items Addressed This Visit    None    Visit Diagnoses    Encounter for gynecological examination without abnormal finding    -  Primary   Relevant Orders   PapIG, HPV, rfx 16/18   Breast screening       Screening for malignant neoplasm of cervix       Relevant Orders   PapIG, HPV, rfx 16/18      2) STI screening  was notoffered and therefore not obtained  2)  ASCCP guidelines and rational discussed.  Patient opts for every 3 years screening interval  3) Contraception - the patient is currently using  OCP (estrogen/progesterone).  She is happy with her current form of contraception and plans to continue  4) Routine healthcare maintenance including cholesterol, diabetes screening discussed managed by PCP  5) Return in about 1 year (around 09/29/2018) for annual.   Malachy Mood, MD, [place], [facility] 09/28/2017, 8:46 AM

## 2017-09-30 LAB — PAPIG, HPV, RFX 16/18
HPV, HIGH-RISK: NEGATIVE
PAP SMEAR COMMENT: 0

## 2017-10-01 ENCOUNTER — Encounter (INDEPENDENT_AMBULATORY_CARE_PROVIDER_SITE_OTHER): Payer: Self-pay

## 2017-11-12 ENCOUNTER — Encounter: Payer: Self-pay | Admitting: Obstetrics and Gynecology

## 2017-11-12 NOTE — Telephone Encounter (Signed)
I  Called and spoke with patient to schedule appointment to follow up. Patient is requesting an call back from Nurse or Dr. Georgianne Fick before she schedules appointment. Patient is anxious about being called to come in for appointment due to the Multiple complaints she is having. Mostly about the break through bleeding. Patient aware message will be sent to Nurse to advise reasons to be schedule. Routing to [employer]

## 2017-11-15 ENCOUNTER — Encounter: Payer: Self-pay | Admitting: Obstetrics and Gynecology

## 2017-11-15 ENCOUNTER — Ambulatory Visit (INDEPENDENT_AMBULATORY_CARE_PROVIDER_SITE_OTHER): Payer: 59 | Admitting: Obstetrics and Gynecology

## 2017-11-15 VITALS — BP 128/84 | Wt 127.0 lb

## 2017-11-15 DIAGNOSIS — N939 Abnormal uterine and vaginal bleeding, unspecified: Secondary | ICD-10-CM | POA: Diagnosis not present

## 2017-11-15 DIAGNOSIS — F411 Generalized anxiety disorder: Secondary | ICD-10-CM | POA: Diagnosis not present

## 2017-11-15 NOTE — Progress Notes (Signed)
Obstetrics & Gynecology Office Visit   Chief Complaint:  Chief Complaint  Patient presents with  . Follow-up    Medication follow up    History of Present Illness: 32 y.o. F7P1025 presenting for medication follow up for abnormal uterine bleeding noted on current OCP.  She is currently being managed with Lo Loestrin Fe.  On her current medication regimen has intermittent breakthrough bleeding.   She has not noted any additional side-effects.  This represents a change as previously she experienced amenorrhea with maybe very light bleeding at the end of the pill pack.  She has not noted breakthrough bleeding week 3 of active pills.    She also is wondering whether the OCP may in fact be worsening her anxiety.  Anxiety still seems to stem mostly around worries regarding possible medical ailments with precipitating factor being her diagnosis of melanoma last year.  Review of Systems: Review of Systems  Constitutional: Negative.   Gastrointestinal: Negative.   Neurological: Negative.   Psychiatric/Behavioral: The patient is nervous/anxious.      Past Medical History:  Past Medical History:  Diagnosis Date  . Medical history non-contributory   . 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 Platelets decreased ([place])   . PONV (postoperative nausea and vomiting)     Past Surgical History:  Past Surgical History:  Procedure Laterality Date  . CESAREAN SECTION N/A 05/03/2015   Procedure: CESAREAN SECTION;  Surgeon: Malachy Mood, MD;  Location: [facility] ORS;  Service: Obstetrics;  Laterality: N/A;  . RETINAL TEAR REPAIR CRYOTHERAPY Right 2008  . TONSILLECTOMY      Gynecologic History: Patient's last menstrual period was 11/11/2017.  Obstetric History: [id number]  Family History:  Family History  Problem Relation Age of Onset  . Healthy Mother   . Healthy Father   . Breast cancer Maternal Grandmother     Social History:  Social History   Socioeconomic History  .  Marital status: Married    Spouse name: Not on file  . Number of children: Not on file  . Years of education: Not on file  . Highest education level: Not on file  Occupational History  . Not on file  Social Needs  . Financial resource strain: Not on file  . Food insecurity:    Worry: Not on file    Inability: Not on file  . Transportation needs:    Medical: Not on file    Non-medical: Not on file  Tobacco Use  . Smoking status: Never Smoker  . Smokeless tobacco: Never Used  Substance and Sexual Activity  . Alcohol use: No  . Drug use: No  . Sexual activity: Yes    Birth control/protection: Pill  Lifestyle  . Physical activity:    Days per week: 5 days    Minutes per session: 30 min  . Stress: Only a little  Relationships  . Social connections:    Talks on phone: Not on file    Gets together: Not on file    Attends religious service: Not on file    Active member of club or organization: Not on file    Attends meetings of clubs or organizations: Not on file    Relationship status: Not on file  . Intimate partner violence:    Fear of current or ex partner: Not on file    Emotionally abused: Not on file    Physically abused: Not on file    Forced sexual activity: Not  on file  Other Topics Concern  . Not on file  Social History Narrative  . Not on file    Allergies:  Allergies  Allergen Reactions  . Macrobid [Nitrofurantoin Monohyd Macro] Hives    Medications: Prior to Admission medications   Medication Sig Start Date End Date Taking? Authorizing Provider  amoxicillin-clavulanate (AUGMENTIN) 875-125 MG tablet Take 1 tablet by mouth every 12 (twelve) hours. 05/10/17   Luvenia Redden, PA-C  fluticasone (FLONASE) 50 MCG/ACT nasal spray Place 1 spray into both nostrils daily. 05/10/17   Luvenia Redden, PA-C  Norethindrone-Ethinyl Estradiol-Fe Biphas (LO LOESTRIN FE) 1 MG-10 MCG / 10 MCG tablet Take 1 tablet by mouth daily. 09/28/17 12/21/17  Malachy Mood, MD     Physical Exam Vitals:  Vitals:   11/15/17 1358  BP: 128/84   Patient's last menstrual period was 11/11/2017.  General: NAD HEENT: normocephalic, anicteric Pulmonary: No increased work of breathing Neurologic: Grossly intact Psychiatric: mood appropriate, affect full  Assessment: 32 y.o. [id number] with AUB-I  Plan: Problem List Items Addressed This Visit    None    Visit Diagnoses    Generalized anxiety disorder    -  Primary   Abnormal uterine bleeding         1) AUB-I - discussed abnormal withdrawal bleeds, breakthrough bleeding, and amenorrhea are all common on Lo Loestrin Fe.  We discussed the following options  A) continue current OCP  B) Switch to 24mcg OCP  C)Cessation of OCP and use of barrier methods  At present patient opts for option C.  Should cycles fail to regulate we discussed further work up using the PALM-COIEN pathway.  2) Anxiety/Depression - discussed rational and expected benefits/side-effects of starting SSRI.  Will contemplate options  3) A total of 15 minutes were spent in face-to-face contact with the patient during this encounter with over half of that time devoted to counseling and coordination of care.  4) Return if symptoms worsen or fail to improve.   Malachy Mood, MD, [facility], [place]

## 2017-11-29 DIAGNOSIS — L814 Other melanin hyperpigmentation: Secondary | ICD-10-CM | POA: Diagnosis not present

## 2017-11-29 DIAGNOSIS — D2270 Melanocytic nevi of unspecified lower limb, including hip: Secondary | ICD-10-CM | POA: Diagnosis not present

## 2017-11-29 DIAGNOSIS — D225 Melanocytic nevi of trunk: Secondary | ICD-10-CM | POA: Diagnosis not present

## 2018-01-31 DIAGNOSIS — H6980 Other specified disorders of Eustachian tube, unspecified ear: Secondary | ICD-10-CM | POA: Diagnosis not present

## 2018-02-09 DIAGNOSIS — H9313 Tinnitus, bilateral: Secondary | ICD-10-CM | POA: Diagnosis not present

## 2018-02-09 DIAGNOSIS — H9120 Sudden idiopathic hearing loss, unspecified ear: Secondary | ICD-10-CM | POA: Diagnosis not present

## 2018-02-11 ENCOUNTER — Other Ambulatory Visit: Payer: Self-pay | Admitting: Obstetrics and Gynecology

## 2018-02-11 MED ORDER — ESCITALOPRAM OXALATE 10 MG PO TABS: 10.0000 mg | ORAL_TABLET | Freq: Every day | ORAL | 2 refills | Status: DC

## 2018-02-24 DIAGNOSIS — H9319 Tinnitus, unspecified ear: Secondary | ICD-10-CM | POA: Diagnosis not present

## 2018-02-24 DIAGNOSIS — H9311 Tinnitus, right ear: Secondary | ICD-10-CM | POA: Diagnosis not present

## 2018-03-16 ENCOUNTER — Encounter: Payer: Self-pay | Admitting: Obstetrics and Gynecology

## 2018-03-16 ENCOUNTER — Ambulatory Visit (INDEPENDENT_AMBULATORY_CARE_PROVIDER_SITE_OTHER): Payer: 59 | Admitting: Obstetrics and Gynecology

## 2018-03-16 VITALS — BP 125/68 | HR 72 | Ht 64.0 in | Wt 127.0 lb

## 2018-03-16 DIAGNOSIS — F411 Generalized anxiety disorder: Secondary | ICD-10-CM

## 2018-03-16 NOTE — Progress Notes (Signed)
Obstetrics & Gynecology Office Visit   Chief Complaint:  Chief Complaint  Patient presents with  . Follow-up    Medication follow up    History of Present Illness: The patient is a 32 y.o. female presenting follow up for symptoms of anxiety and depression.  The patient is currently taking lexparo 10mg  for the management of her symptoms.  She has not had any recent situational stressors.  She reports symptoms of irritability, decreased appetite, social anxiety and visual hallucinations.  She denies anhedonia, agorophobia, feelings of worthlessness, suicidal ideation, homicidal ideation, auditory hallucinations and visual hallucinations. Symptoms have improved since last visit.   Still worries about health issues some.  Review of Systems: Review of Systems  Constitutional: Negative.   Gastrointestinal: Negative for nausea.  Neurological: Negative for headaches.  Psychiatric/Behavioral: Negative for depression, hallucinations, memory loss, substance abuse and suicidal ideas. The patient is nervous/anxious. The patient does not have insomnia.      Past Medical History:  Past Medical History:  Diagnosis Date  . Medical history non-contributory   . 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 Platelets decreased ([place])   . PONV (postoperative nausea and vomiting)     Past Surgical History:  Past Surgical History:  Procedure Laterality Date  . CESAREAN SECTION N/A 05/03/2015   Procedure: CESAREAN SECTION;  Surgeon: Malachy Mood, MD;  Location: [facility] ORS;  Service: Obstetrics;  Laterality: N/A;  . RETINAL TEAR REPAIR CRYOTHERAPY Right 2008  . TONSILLECTOMY      Gynecologic History: No LMP recorded. (Menstrual status: Oral contraceptives).  Obstetric History: [id number]  Family History:  Family History  Problem Relation Age of Onset  . Healthy Mother   . Healthy Father   . Breast cancer Maternal Grandmother     Social History:  Social History   Socioeconomic  History  . Marital status: Married    Spouse name: Not on file  . Number of children: Not on file  . Years of education: Not on file  . Highest education level: Not on file  Occupational History  . Not on file  Social Needs  . Financial resource strain: Not on file  . Food insecurity:    Worry: Not on file    Inability: Not on file  . Transportation needs:    Medical: Not on file    Non-medical: Not on file  Tobacco Use  . Smoking status: Never Smoker  . Smokeless tobacco: Never Used  Substance and Sexual Activity  . Alcohol use: No  . Drug use: No  . Sexual activity: Yes    Birth control/protection: Pill  Lifestyle  . Physical activity:    Days per week: 5 days    Minutes per session: 30 min  . Stress: Only a little  Relationships  . Social connections:    Talks on phone: Not on file    Gets together: Not on file    Attends religious service: Not on file    Active member of club or organization: Not on file    Attends meetings of clubs or organizations: Not on file    Relationship status: Not on file  . Intimate partner violence:    Fear of current or ex partner: Not on file    Emotionally abused: Not on file    Physically abused: Not on file    Forced sexual activity: Not on file  Other Topics Concern  . Not on file  Social History Narrative  .  Not on file    Allergies:  Allergies  Allergen Reactions  . Macrobid [Nitrofurantoin Monohyd Macro] Hives    Medications: Prior to Admission medications   Medication Sig Start Date End Date Taking? Authorizing Provider  escitalopram (LEXAPRO) 10 MG tablet Take 1 tablet (10 mg total) by mouth daily. 02/11/18  Yes Malachy Mood, MD  fluticasone (FLONASE) 50 MCG/ACT nasal spray Place 1 spray into both nostrils daily. 05/10/17  Yes Luvenia Redden, PA-C  amoxicillin-clavulanate (AUGMENTIN) 875-125 MG tablet Take 1 tablet by mouth every 12 (twelve) hours. Patient not taking: Reported on 03/16/2018 05/10/17   Luvenia Redden, PA-C  Norethindrone-Ethinyl Estradiol-Fe Biphas (LO LOESTRIN FE) 1 MG-10 MCG / 10 MCG tablet Take 1 tablet by mouth daily. 09/28/17 12/21/17  Malachy Mood, MD    Physical Exam Vitals:  Vitals:   03/16/18 1642  BP: 125/68  Pulse: 72   No LMP recorded. (Menstrual status: Oral contraceptives).  General: NAD HEENT: normocephalic, anicteric Pulmonary: No increased work of breathing Neurologic: Grossly intact Psychiatric: mood appropriate, affect full    GAD 7 : Generalized Anxiety Score 03/16/2018  Nervous, Anxious, on Edge 1  Control/stop worrying 1  Worry too much - different things 2  Trouble relaxing 1  Restless 0  Easily annoyed or irritable 0  Afraid - awful might happen 1  Total GAD 7 Score 6  Anxiety Difficulty Somewhat difficult    Depression screen PHQ 2/9 03/16/2018  Decreased Interest 0  Down, Depressed, Hopeless 1  PHQ - 2 Score 1  Altered sleeping 1  Tired, decreased energy 1  Change in appetite 0  Feeling bad or failure about yourself  0  Trouble concentrating 1  Moving slowly or fidgety/restless 0  Suicidal thoughts 0  PHQ-9 Score 4  Difficult doing work/chores Somewhat difficult    Depression screen [facility] 2/9 03/16/2018  Decreased Interest 0  Down, Depressed, Hopeless 1  PHQ - 2 Score 1  Altered sleeping 1  Tired, decreased energy 1  Change in appetite 0  Feeling bad or failure about yourself  0  Trouble concentrating 1  Moving slowly or fidgety/restless 0  Suicidal thoughts 0  PHQ-9 Score 4  Difficult doing work/chores Somewhat difficult     Assessment: 32 y.o. G3P2011 anxiety/depression follow up  Plan: Problem List Items Addressed This Visit    None    Visit Diagnoses    Generalized anxiety disorder    -  Primary   Relevant Orders   B12   TSH   CBC      1) GAD-7 is 6, PHQ-9 is 4, Continue lexapro at 10mg  re-eval in 6 weeks  2) Thyroid and B12 screen has not been obtained previously - ordered today as future  order   Malachy Mood, MD, [place], [place]

## 2018-03-21 ENCOUNTER — Other Ambulatory Visit: Payer: 59

## 2018-03-21 DIAGNOSIS — F411 Generalized anxiety disorder: Secondary | ICD-10-CM

## 2018-03-22 LAB — CBC
HEMATOCRIT: 41.1 % (ref 34.0–46.6)
Hemoglobin: 14 g/dL (ref 11.1–15.9)
MCH: 34.2 pg — ABNORMAL HIGH (ref 26.6–33.0)
MCHC: 34.1 g/dL (ref 31.5–35.7)
MCV: 101 fL — AB (ref 79–97)
PLATELETS: 235 10*3/uL (ref 150–450)
RBC: 4.09 x10E6/uL (ref 3.77–5.28)
RDW: 11.7 % — ABNORMAL LOW (ref 12.3–15.4)
WBC: 6 10*3/uL (ref 3.4–10.8)

## 2018-03-22 LAB — TSH: TSH: 1.1 u[IU]/mL (ref 0.450–4.500)

## 2018-03-22 LAB — VITAMIN B12: Vitamin B-12: 449 pg/mL (ref 232–1245)

## 2018-05-10 ENCOUNTER — Other Ambulatory Visit: Payer: Self-pay | Admitting: Obstetrics and Gynecology

## 2018-05-11 ENCOUNTER — Ambulatory Visit (INDEPENDENT_AMBULATORY_CARE_PROVIDER_SITE_OTHER): Payer: 59 | Admitting: Obstetrics and Gynecology

## 2018-05-11 ENCOUNTER — Encounter: Payer: Self-pay | Admitting: Obstetrics and Gynecology

## 2018-05-11 VITALS — BP 113/58 | HR 76 | Ht 64.0 in | Wt 135.0 lb

## 2018-05-11 DIAGNOSIS — F419 Anxiety disorder, unspecified: Secondary | ICD-10-CM | POA: Diagnosis not present

## 2018-05-11 NOTE — Progress Notes (Signed)
Obstetrics & Gynecology Office Visit   Chief Complaint:  Chief Complaint  Patient presents with  . Follow-up    Anxiety/depression medication follow up    History of Present Illness: The patient is a 33 y.o. female presenting follow up for symptoms of anxiety.  The patient is currently taking lexparo 10mg  daily for the management of her symptoms.  She has had any recent situational stressors, follow up on oral lesion which ended up being benign but took this in stide.   Has noted some slight increase in appetite on Lexapro.  She denies anhedonia, day time somnolence, insomnia, risk taking behavior, irritability, decreased appetite, social anxiety, agorophobia, feelings of guilt, feelings of worthlessness, suicidal ideation, homicidal ideation, auditory hallucinations and visual hallucinations. Symptoms have improved since last visit.     Review of Systems: 10 point review of systems negative unless otherwise noted in HPI  Past Medical History:  Past Medical History:  Diagnosis Date  . Medical history non-contributory   . Melanoma in situ of left upper arm ([place]) 11/2016  . Platelets decreased ([place])   . PONV (postoperative nausea and vomiting)     Past Surgical History:  Past Surgical History:  Procedure Laterality Date  . CESAREAN SECTION N/A 05/03/2015   Procedure: CESAREAN SECTION;  Surgeon: Malachy Mood, MD;  Location: [facility] ORS;  Service: Obstetrics;  Laterality: N/A;  . RETINAL TEAR REPAIR CRYOTHERAPY Right 2008  . TONSILLECTOMY      Gynecologic History: No LMP recorded. (Menstrual status: Oral contraceptives).  Obstetric History: [id number]  Family History:  Family History  Problem Relation Age of Onset  . Healthy Mother   . Healthy Father   . Breast cancer Maternal Grandmother     Social History:  Social History   Socioeconomic History  . Marital status: Married    Spouse name: Not on file  . Number of children: Not on file  . Years of education: Not on  file  . Highest education level: Not on file  Occupational History  . Not on file  Social Needs  . Financial resource strain: Not on file  . Food insecurity:    Worry: Not on file    Inability: Not on file  . Transportation needs:    Medical: Not on file    Non-medical: Not on file  Tobacco Use  . Smoking status: Never Smoker  . Smokeless tobacco: Never Used  Substance and Sexual Activity  . Alcohol use: No  . Drug use: No  . Sexual activity: Yes    Birth control/protection: Pill  Lifestyle  . Physical activity:    Days per week: 5 days    Minutes per session: 30 min  . Stress: Only a little  Relationships  . Social connections:    Talks on phone: Not on file    Gets together: Not on file    Attends religious service: Not on file    Active member of club or organization: Not on file    Attends meetings of clubs or organizations: Not on file    Relationship status: Not on file  . Intimate partner violence:    Fear of current or ex partner: Not on file    Emotionally abused: Not on file    Physically abused: Not on file    Forced sexual activity: Not on file  Other Topics Concern  . Not on file  Social History Narrative  . Not on file    Allergies:  Allergies  Allergen Reactions  . Macrobid [Nitrofurantoin Monohyd Macro] Hives    Medications: Prior to Admission medications   Medication Sig Start Date End Date Taking? Authorizing Provider  escitalopram (LEXAPRO) 10 MG tablet Take 1 tablet (10 mg total) by mouth daily. 02/11/18  Yes Malachy Mood, MD  Norethindrone-Ethinyl Estradiol-Fe Biphas (LO LOESTRIN FE) 1 MG-10 MCG / 10 MCG tablet Take 1 tablet by mouth daily. 09/28/17 12/21/17  Malachy Mood, MD    Physical Exam Vitals:  Vitals:   05/11/18 1017  BP: (!) 113/58  Pulse: 76   No LMP recorded. (Menstrual status: Oral contraceptives).  General: NAD HEENT: normocephalic, anicteric Pulmonary: No increased work of breathing Neurologic: Grossly  intact Psychiatric: mood appropriate, affect full    GAD 7 : Generalized Anxiety Score 05/11/2018 03/16/2018  Nervous, Anxious, on Edge 1 1  Control/stop worrying 1 1  Worry too much - different things 1 2  Trouble relaxing 0 1  Restless 0 0  Easily annoyed or irritable 0 0  Afraid - awful might happen 1 1  Total GAD 7 Score 4 6  Anxiety Difficulty Somewhat difficult Somewhat difficult    Depression screen [facility] 2/9 05/11/2018 03/16/2018  Decreased Interest 1 0  Down, Depressed, Hopeless 0 1  PHQ - 2 Score 1 1  Altered sleeping 0 1  Tired, decreased energy 0 1  Change in appetite 0 0  Feeling bad or failure about yourself  0 0  Trouble concentrating 0 1  Moving slowly or fidgety/restless 0 0  Suicidal thoughts 0 0  PHQ-9 Score 1 4  Difficult doing work/chores Not difficult at all Somewhat difficult    Depression screen [facility] 2/9 05/11/2018 03/16/2018  Decreased Interest 1 0  Down, Depressed, Hopeless 0 1  PHQ - 2 Score 1 1  Altered sleeping 0 1  Tired, decreased energy 0 1  Change in appetite 0 0  Feeling bad or failure about yourself  0 0  Trouble concentrating 0 1  Moving slowly or fidgety/restless 0 0  Suicidal thoughts 0 0  PHQ-9 Score 1 4  Difficult doing work/chores Not difficult at all Somewhat difficult     Assessment: 33 y.o. G3P2011 follow up for depression and anxiety  Plan: Problem List Items Addressed This Visit    None    Visit Diagnoses    Anxiety    -  Primary      1) Anxiety - Doing well on lexapro 10mg , continue at present dose GAD-7 is 4 PHQ-9 is 1  2) Thyroid and B12 screen has been obtained previously  3) A total of 15 minutes were spent in face-to-face contact with the patient during this encounter with over half of that time devoted to counseling and coordination of care.  4) Return in about 1 year (around 05/12/2019) for annual.    Malachy Mood, MD, [place], [place]

## 2018-05-13 ENCOUNTER — Other Ambulatory Visit: Payer: Self-pay | Admitting: Obstetrics and Gynecology

## 2018-05-13 MED ORDER — ESCITALOPRAM OXALATE 10 MG PO TABS: 10.0000 mg | ORAL_TABLET | Freq: Every day | ORAL | 11 refills | Status: DC

## 2018-06-06 DIAGNOSIS — D2270 Melanocytic nevi of unspecified lower limb, including hip: Secondary | ICD-10-CM | POA: Diagnosis not present

## 2018-06-06 DIAGNOSIS — Z86006 Personal history of melanoma in-situ: Secondary | ICD-10-CM | POA: Diagnosis not present

## 2018-06-06 DIAGNOSIS — D1801 Hemangioma of skin and subcutaneous tissue: Secondary | ICD-10-CM | POA: Diagnosis not present

## 2018-07-29 IMAGING — US US SOFT TISSUE HEAD/NECK
1 series · 14 of 18 positions shown · non-contrast
Comparison: None.

CLINICAL DATA: 31-year-old female with a 3 month history of
right-sided neck mass.

EXAM:
ULTRASOUND OF HEAD/NECK SOFT TISSUES
TECHNIQUE: Ultrasound examination of the head and neck soft tissues was
performed in the area of clinical concern.

[Series 1: us soft tissue head/neck · 0.05mm/px · 18 acquisitions, 14 frames shown]
[im 1/18]
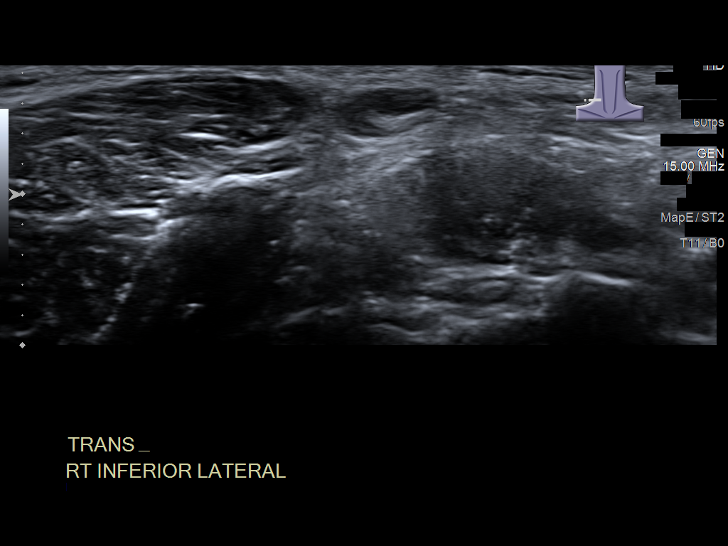
[im 2/18]
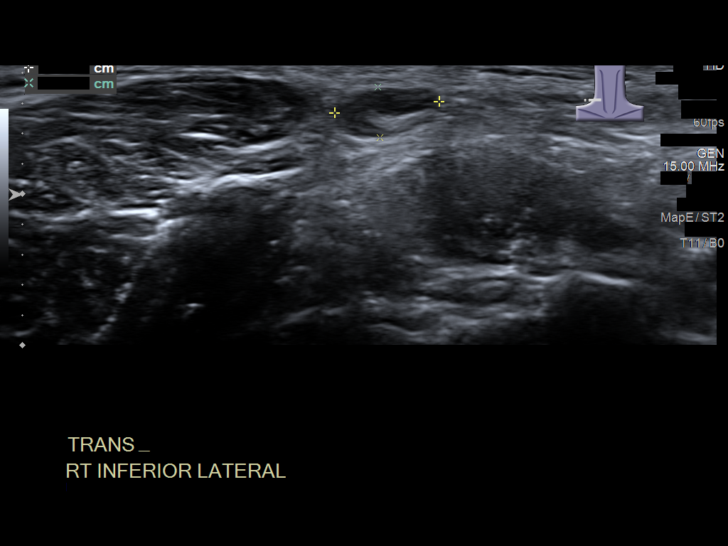
[im 4/18]
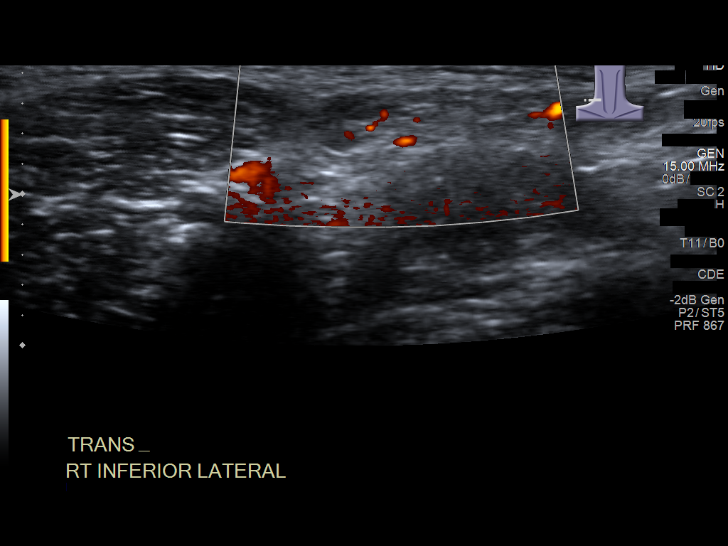
[im 5/18]
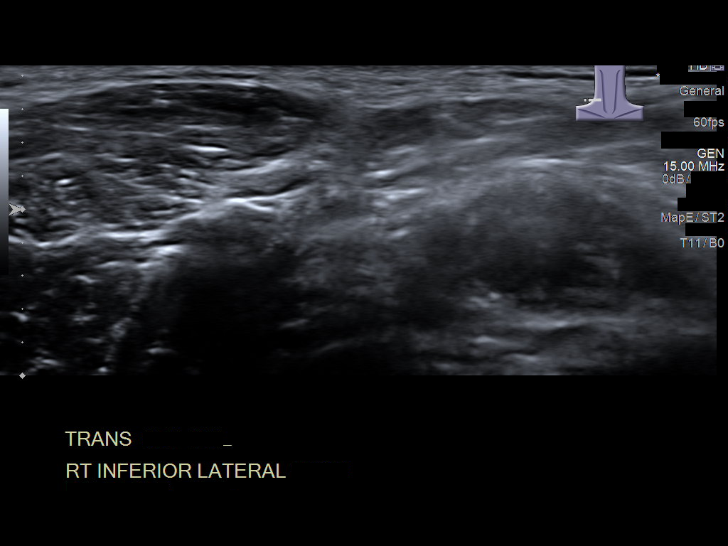
[im 6/18]
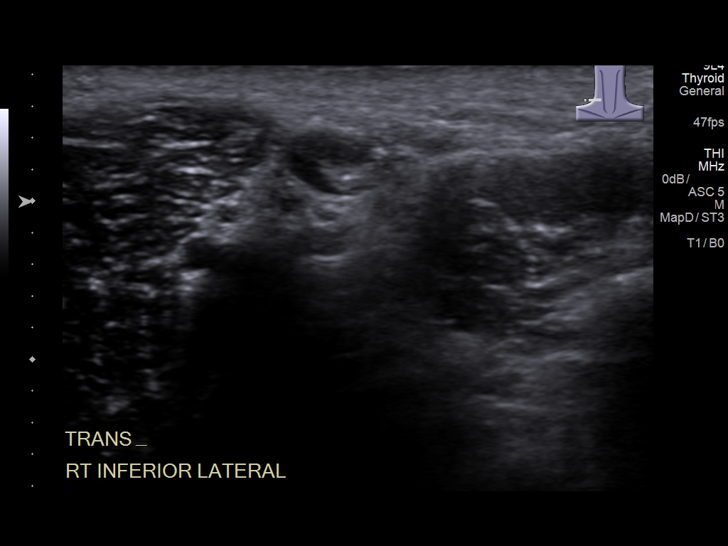
[im 8/18]
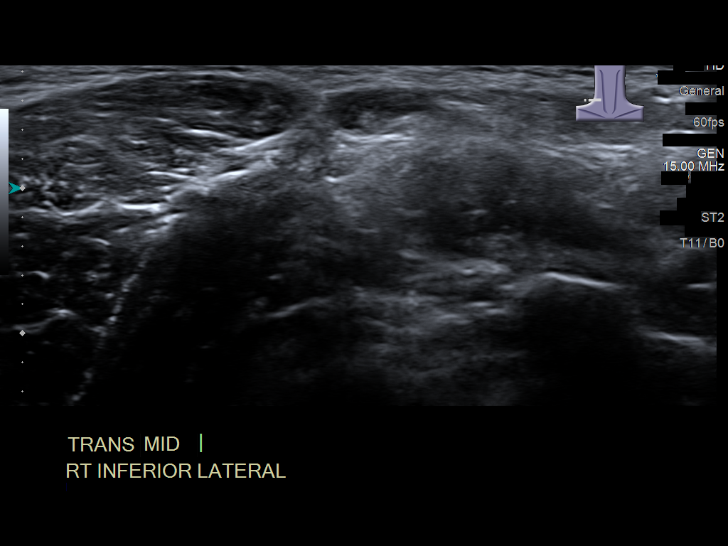
[im 9/18]
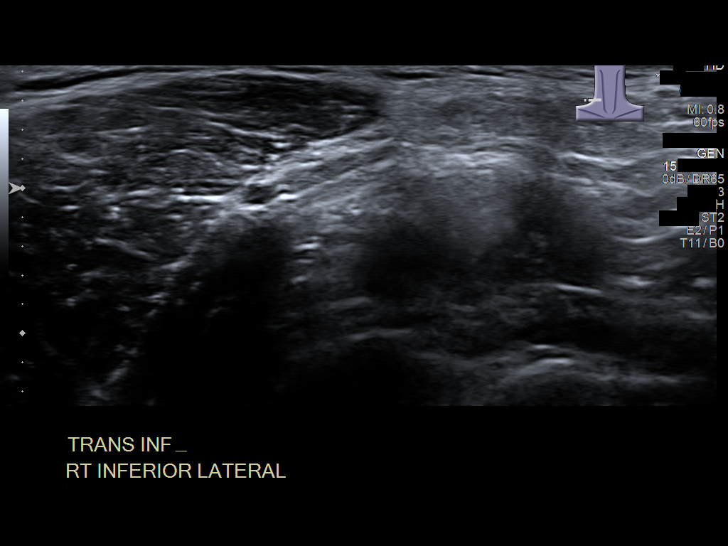
[im 10/18]
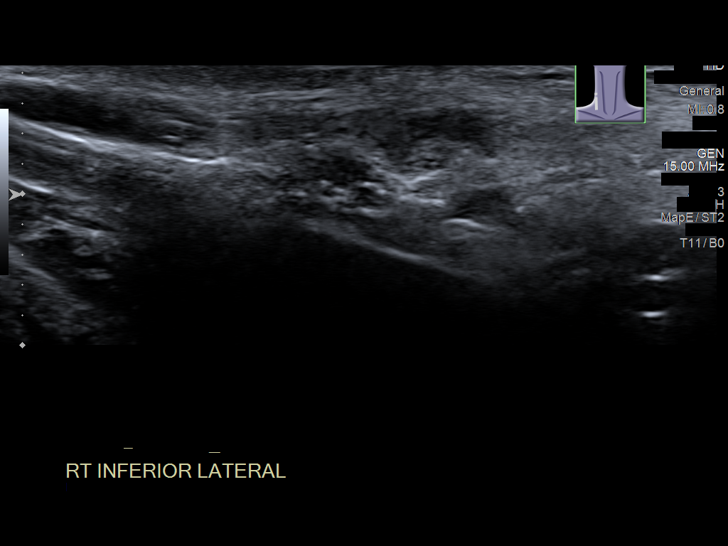
[im 11/18]
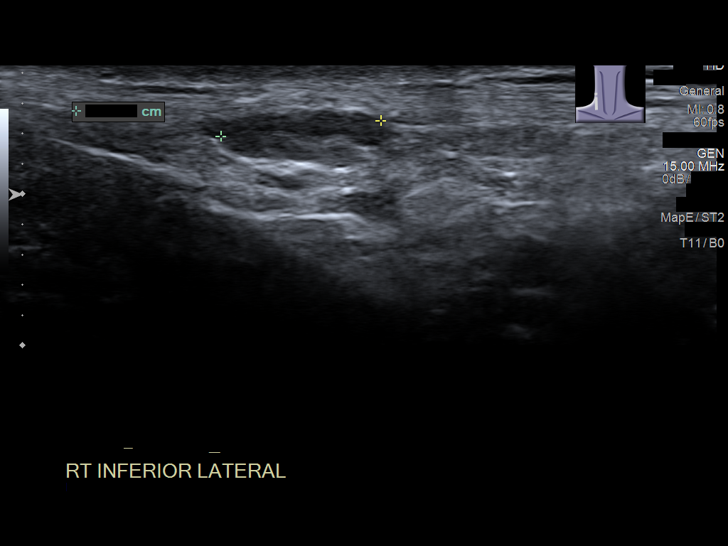
[im 13/18]
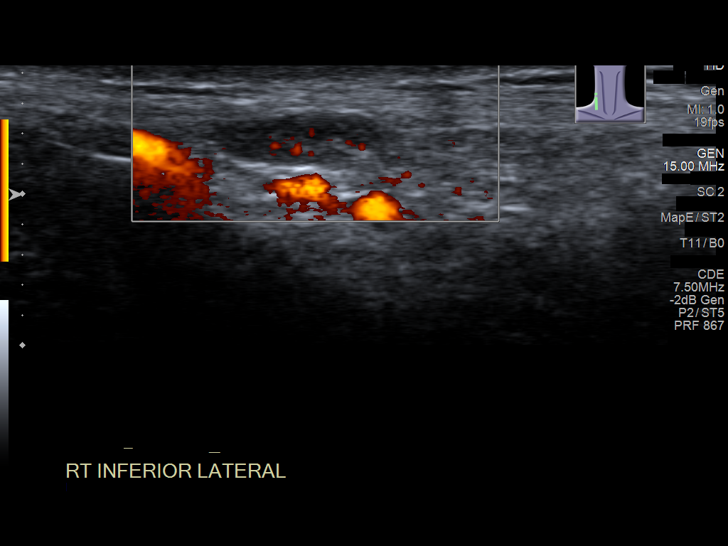
[im 14/18]
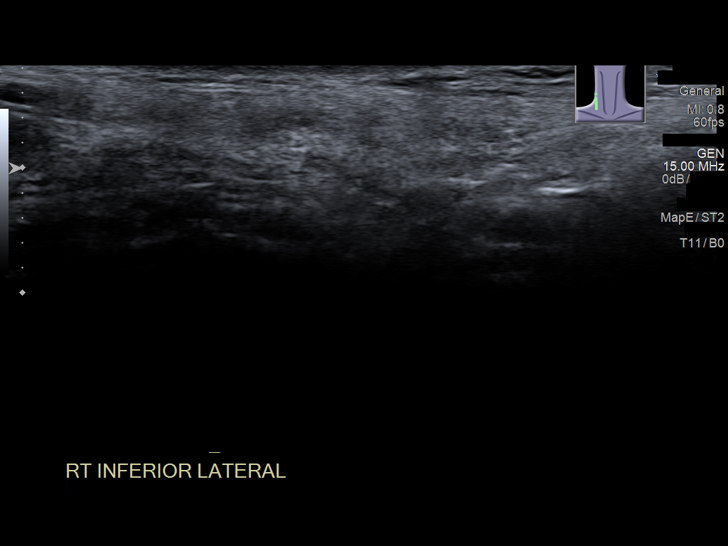
[im 15/18]
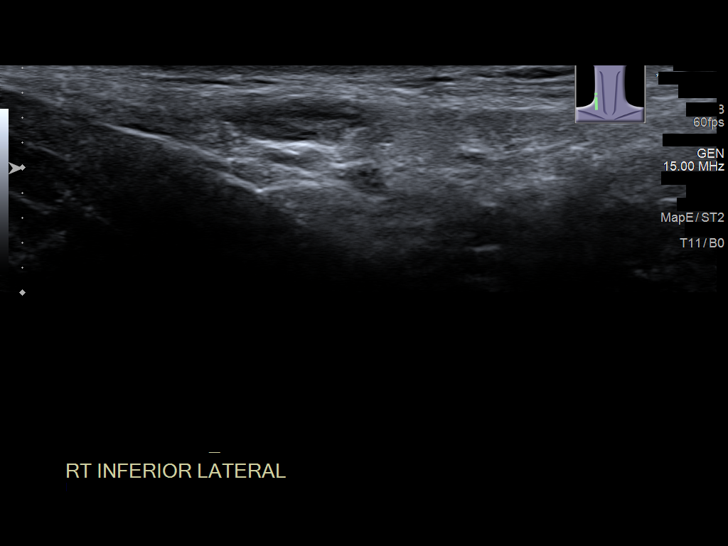
[im 17/18]
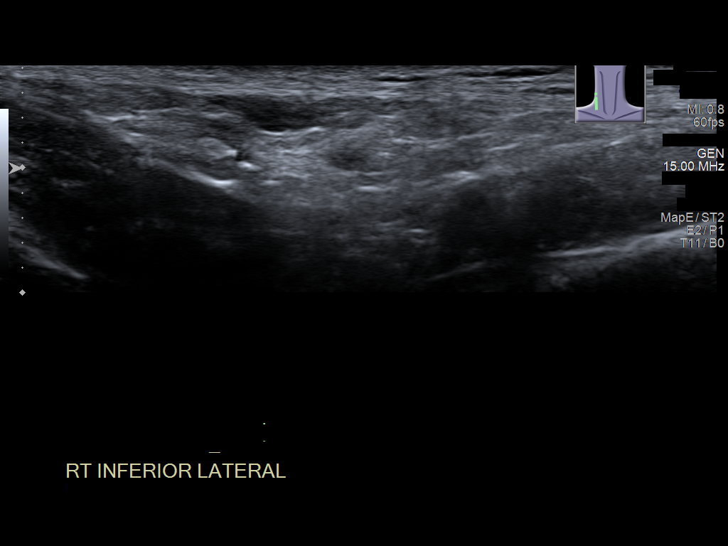
[im 18/18]
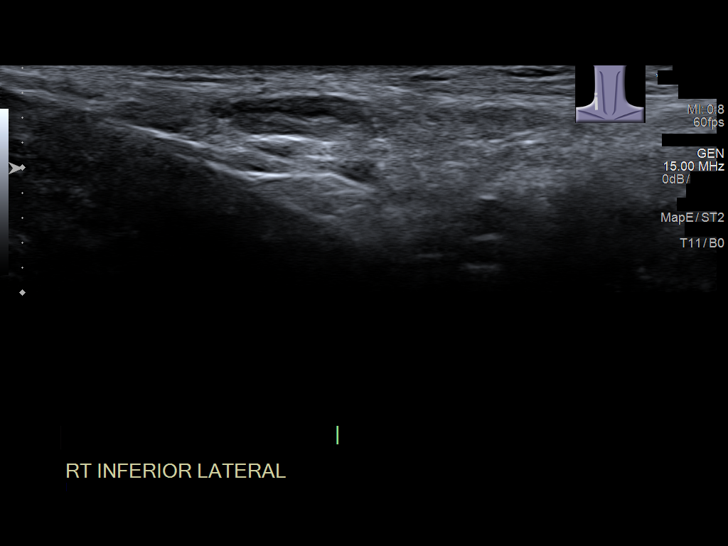

[14 of 18 positions shown; findings below may reference images not displayed]

FINDINGS: Sonographic interrogation of the region of clinical concern in the
right inferolateral neck demonstrates a hypoechoic soft tissue
structure with an echogenic fatty hilum and vascular pedicle. The
sonographic appearance is most consistent with a lymph node. The
node is not enlarged at 0.3 cm in short axis.
IMPRESSION: The palpable abnormality corresponds with a sonographically
unremarkable lymph node.

## 2018-09-23 ENCOUNTER — Other Ambulatory Visit: Payer: Self-pay | Admitting: Obstetrics and Gynecology

## 2018-09-26 NOTE — Telephone Encounter (Signed)
Annual is scheduled for 09/2018

## 2018-10-04 ENCOUNTER — Other Ambulatory Visit: Payer: Self-pay

## 2018-10-04 ENCOUNTER — Ambulatory Visit (INDEPENDENT_AMBULATORY_CARE_PROVIDER_SITE_OTHER): Payer: 59 | Admitting: Obstetrics and Gynecology

## 2018-10-04 ENCOUNTER — Encounter: Payer: Self-pay | Admitting: Obstetrics and Gynecology

## 2018-10-04 VITALS — BP 124/76 | HR 82 | Ht 64.0 in | Wt 143.0 lb

## 2018-10-04 DIAGNOSIS — Z1239 Encounter for other screening for malignant neoplasm of breast: Secondary | ICD-10-CM

## 2018-10-04 DIAGNOSIS — Z01419 Encounter for gynecological examination (general) (routine) without abnormal findings: Secondary | ICD-10-CM

## 2018-10-04 DIAGNOSIS — Z3041 Encounter for surveillance of contraceptive pills: Secondary | ICD-10-CM

## 2018-10-04 MED ORDER — LO LOESTRIN FE 1 MG-10 MCG / 10 MCG PO TABS: 1.0000 | ORAL_TABLET | Freq: Every day | ORAL | 3 refills | Status: DC

## 2018-10-04 MED ORDER — ESCITALOPRAM OXALATE 10 MG PO TABS: 10.0000 mg | ORAL_TABLET | Freq: Every day | ORAL | 11 refills | Status: DC

## 2018-10-04 NOTE — Progress Notes (Signed)
Gynecology Annual Exam   PCP: Dion Body, MD  Chief Complaint:  Chief Complaint  Patient presents with  . Gynecologic Exam    History of Present Illness: Patient is a 33 y.o. G3P2011 presents for annual exam. The patient has no complaints today.   LMP: Patient's last menstrual period was 08/30/2018. Average Interval: irregular, still having occasional spotting 3rd week of pill pack Duration of flow: 3 days Heavy Menses: no Clots: no Intermenstrual Bleeding: no Postcoital Bleeding: no Dysmenorrhea: no  The patient is sexually active. She currently uses OCP (estrogen/progesterone) for contraception. She denies dyspareunia.  The patient does perform self breast exams.  There is no notable family history of breast or ovarian cancer in her family.  The patient wears seatbelts: yes.   The patient has regular exercise: not asked.    The patient denies current symptoms of depression.    Review of Systems: Review of Systems  Constitutional: Negative for chills and fever.  HENT: Negative for congestion.   Respiratory: Negative for cough and shortness of breath.   Cardiovascular: Negative for chest pain and palpitations.  Gastrointestinal: Negative for abdominal pain, constipation, diarrhea, heartburn, nausea and vomiting.  Genitourinary: Negative for dysuria, frequency and urgency.  Skin: Negative for itching and rash.  Neurological: Negative for dizziness and headaches.  Endo/Heme/Allergies: Negative for polydipsia.  Psychiatric/Behavioral: Negative for depression.    Past Medical History:  Past Medical History:  Diagnosis Date  . Medical history non-contributory   . Melanoma in situ of left upper arm ([place]) 11/2016  . Platelets decreased ([place])   . PONV (postoperative nausea and vomiting)     Past Surgical History:  Past Surgical History:  Procedure Laterality Date  . CESAREAN SECTION N/A 05/03/2015   Procedure: CESAREAN SECTION;  Surgeon: Malachy Mood,  MD;  Location: [facility] ORS;  Service: Obstetrics;  Laterality: N/A;  . RETINAL TEAR REPAIR CRYOTHERAPY Right 2008  . TONSILLECTOMY      Gynecologic History:  Patient's last menstrual period was 08/30/2018. Contraception: OCP (estrogen/progesterone) Last Pap: 09/28/2017 Results were: NIL and HR HPV negative   Obstetric History: [id number]  Family History:  Family History  Problem Relation Age of Onset  . Healthy Mother   . Healthy Father   . Breast cancer Maternal Grandmother     Social History:  Social History   Socioeconomic History  . Marital status: Married    Spouse name: Not on file  . Number of children: Not on file  . Years of education: Not on file  . Highest education level: Not on file  Occupational History  . Not on file  Social Needs  . Financial resource strain: Not on file  . Food insecurity    Worry: Not on file    Inability: Not on file  . Transportation needs    Medical: Not on file    Non-medical: Not on file  Tobacco Use  . Smoking status: Never Smoker  . Smokeless tobacco: Never Used  Substance and Sexual Activity  . Alcohol use: No  . Drug use: No  . Sexual activity: Yes    Birth control/protection: Pill  Lifestyle  . Physical activity    Days per week: 5 days    Minutes per session: 30 min  . Stress: Only a little  Relationships  . Social Herbalist on phone: Not on file    Gets together: Not on file    Attends religious service: Not on file  Active member of club or organization: Not on file    Attends meetings of clubs or organizations: Not on file    Relationship status: Not on file  . Intimate partner violence    Fear of current or ex partner: Not on file    Emotionally abused: Not on file    Physically abused: Not on file    Forced sexual activity: Not on file  Other Topics Concern  . Not on file  Social History Narrative  . Not on file    Allergies:  Allergies  Allergen Reactions  . Macrobid [Nitrofurantoin  Monohyd Macro] Hives    Medications: Prior to Admission medications   Medication Sig Start Date End Date Taking? Authorizing Provider  escitalopram (LEXAPRO) 10 MG tablet Take 1 tablet (10 mg total) by mouth daily. 05/13/18   Malachy Mood, MD  LO LOESTRIN FE 1 MG-10 MCG / 10 MCG tablet TAKE 1 TABLET BY MOUTH DAILY 09/26/18   Malachy Mood, MD    Physical Exam Vitals: Blood pressure 124/76, pulse 82, height 5\' 4"  (1.626 m), weight 143 lb (64.9 kg), last menstrual period 08/30/2018.  General: NAD HEENT: normocephalic, anicteric Thyroid: no enlargement, no palpable nodules Pulmonary: No increased work of breathing, CTAB Cardiovascular: RRR, distal pulses 2+ Breast: Breast symmetrical, no tenderness, no palpable nodules or masses, no skin or nipple retraction present, no nipple discharge.  No axillary or supraclavicular lymphadenopathy. Abdomen: NABS, soft, non-tender, non-distended.  Umbilicus without lesions.  No hepatomegaly, splenomegaly or masses palpable. No evidence of hernia  Genitourinary:  External: Normal external female genitalia.  Normal urethral meatus, normal Bartholin's and Skene's glands.    Vagina: Normal vaginal mucosa, no evidence of prolapse.    Cervix: Grossly normal in appearance, no bleeding  Uterus: Non-enlarged, mobile, normal contour.  No CMT  Adnexa: ovaries non-enlarged, no adnexal masses  Rectal: deferred  Lymphatic: no evidence of inguinal lymphadenopathy Extremities: no edema, erythema, or tenderness Neurologic: Grossly intact Psychiatric: mood appropriate, affect full  Female chaperone present for pelvic and breast  portions of the physical exam  GAD 7 : Generalized Anxiety Score 10/04/2018 05/11/2018 03/16/2018  Nervous, Anxious, on Edge 1 1 1   Control/stop worrying 0 1 1  Worry too much - different things 0 1 2  Trouble relaxing 0 0 1  Restless 0 0 0  Easily annoyed or irritable 1 0 0  Afraid - awful might happen 0 1 1  Total GAD 7 Score 2  4 6   Anxiety Difficulty Somewhat difficult Somewhat difficult Somewhat difficult      Assessment: 33 y.o. G3P2011 routine annual exam  Plan: Problem List Items Addressed This Visit    None    Visit Diagnoses    Encounter for gynecological examination without abnormal finding    -  Primary   Breast screening       Surveillance for birth control, oral contraceptives          1) STI screening  was notoffered and therefore not obtained  2)  ASCCP guidelines and rational discussed.  Patient opts for every 3 years screening interval  3) Contraception - the patient is currently using  OCP (estrogen/progesterone).  She is happy with her current form of contraception and plans to continue - refill today  4) Routine healthcare maintenance including cholesterol, diabetes screening discussed managed by PCP  5) Anxiety - currently on Lexapro 10mg  po daily - refill today  6) No follow-ups on file.   Malachy Mood, MD, [facility], [facility] 10/04/2018, 1:33 PM

## 2018-12-23 IMAGING — US US EXTREM LOW*L* LIMITED
1 series · 5 of 5 positions shown · non-contrast
Comparison: None

CLINICAL DATA: Palpable soft tissue mass in the left calf

EXAM:
ULTRASOUND left LOWER EXTREMITY LIMITED
TECHNIQUE: Ultrasound examination of the lower extremity soft tissues was
performed in the area of clinical concern.

[Series 1: us extrem low*left* limited · 0.07mm/px · 5 of 5 slices shown]
[im 1/5]
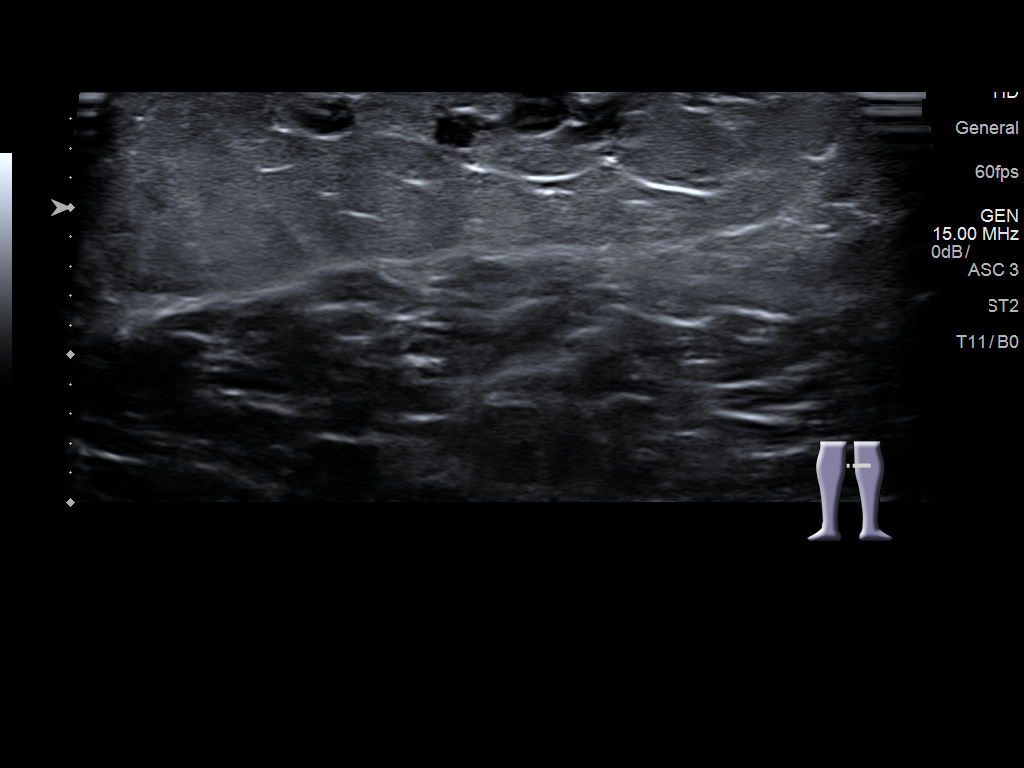
[im 2/5]
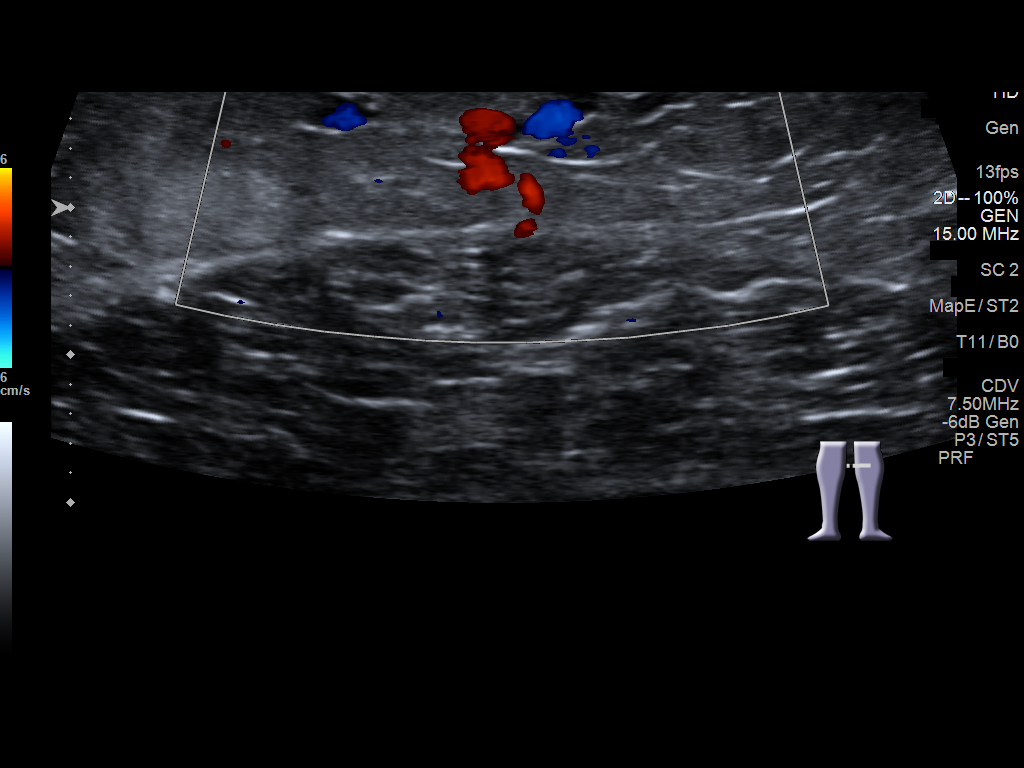
[im 3/5]
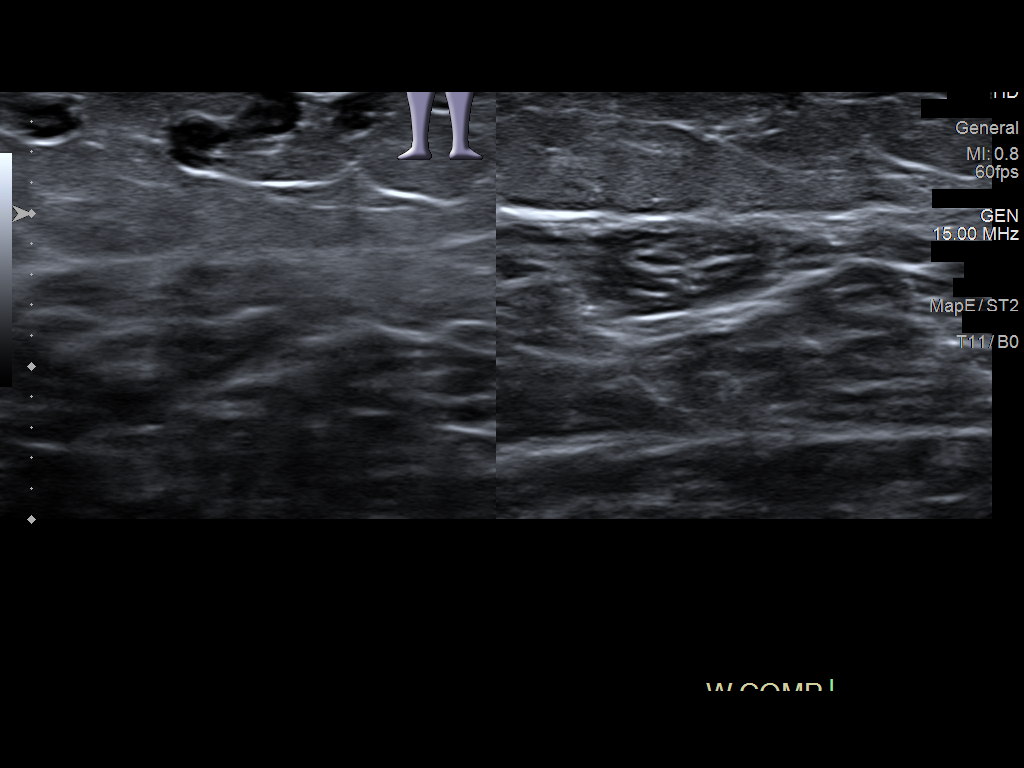
[im 4/5]
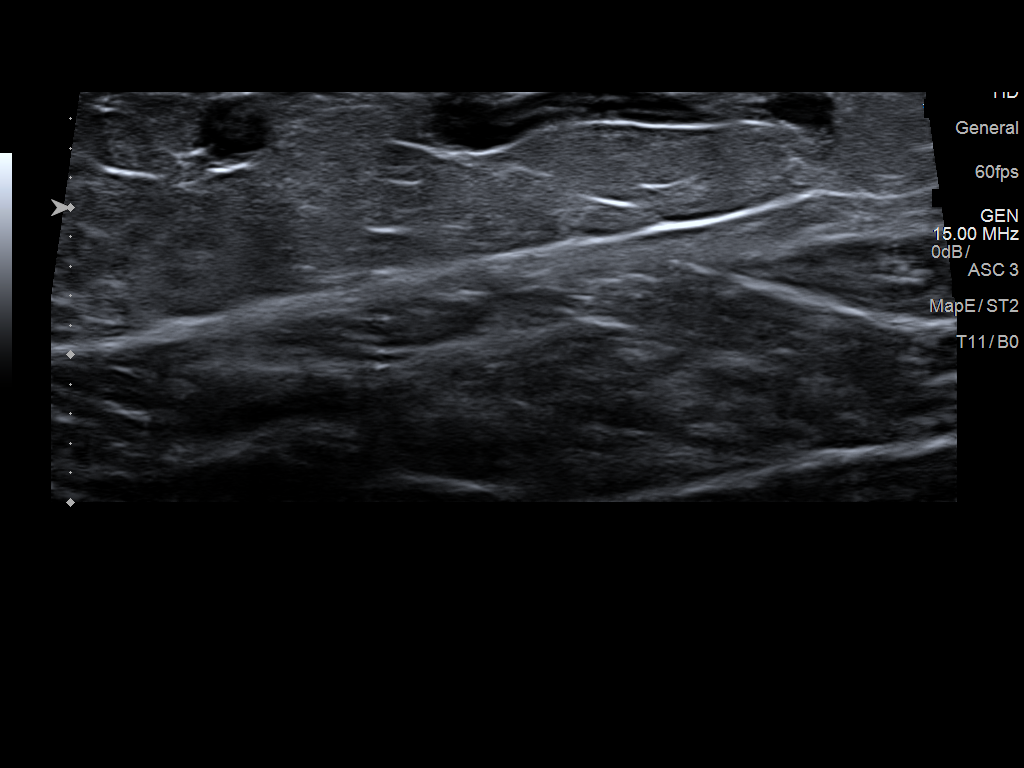
[im 5/5]
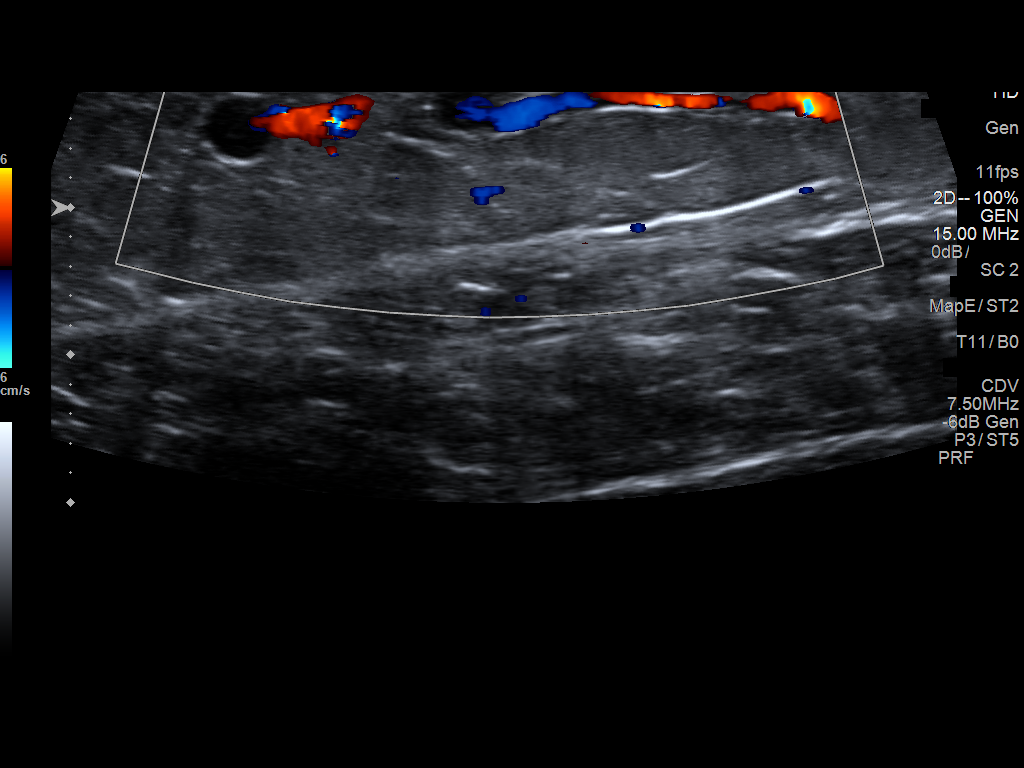

[5 of 5 positions shown; findings below may reference images not displayed]

FINDINGS: Ultrasound over the area in question within the left calf was
performed. This area corresponds to a patent varicose vein. No soft
tissue mass is seen.
IMPRESSION: The area in question corresponds to a patent varicose vein within
the left calf.

## 2019-10-10 ENCOUNTER — Other Ambulatory Visit: Payer: Self-pay

## 2019-10-10 ENCOUNTER — Other Ambulatory Visit (HOSPITAL_COMMUNITY)
Admission: RE | Admit: 2019-10-10 | Discharge: 2019-10-10 | Disposition: A | Discharge status: Home / Self Care | Payer: 59 | Source: Ambulatory Visit | Attending: Obstetrics and Gynecology | Admitting: Obstetrics and Gynecology

## 2019-10-10 ENCOUNTER — Ambulatory Visit (INDEPENDENT_AMBULATORY_CARE_PROVIDER_SITE_OTHER): Payer: 59 | Admitting: Obstetrics and Gynecology

## 2019-10-10 ENCOUNTER — Encounter: Payer: Self-pay | Admitting: Obstetrics and Gynecology

## 2019-10-10 VITALS — BP 112/79 | Wt 134.0 lb

## 2019-10-10 DIAGNOSIS — Z1239 Encounter for other screening for malignant neoplasm of breast: Secondary | ICD-10-CM | POA: Diagnosis not present

## 2019-10-10 DIAGNOSIS — Z01419 Encounter for gynecological examination (general) (routine) without abnormal findings: Secondary | ICD-10-CM

## 2019-10-10 DIAGNOSIS — Z124 Encounter for screening for malignant neoplasm of cervix: Secondary | ICD-10-CM

## 2019-10-10 DIAGNOSIS — Z3169 Encounter for other general counseling and advice on procreation: Secondary | ICD-10-CM

## 2019-10-10 MED ORDER — ESCITALOPRAM OXALATE 10 MG PO TABS: 10.0000 mg | ORAL_TABLET | Freq: Every day | ORAL | 11 refills | Status: DC

## 2019-10-10 MED ORDER — LO LOESTRIN FE 1 MG-10 MCG / 10 MCG PO TABS: 1.0000 | ORAL_TABLET | Freq: Every day | ORAL | 3 refills | Status: DC

## 2019-10-10 NOTE — Progress Notes (Signed)
Gynecology Annual Exam   PCP: Dion Body, MD  Chief Complaint:  Chief Complaint  Patient presents with  . Gynecologic Exam    History of Present Illness: Patient is a 34 y.o. G3P2011 presents for annual exam. The patient has no complaints today.   LMP: Patient's last menstrual period was 08/28/2019 (exact date). Mostly amenorrhea on Lo Loestrin Fe  The patient is sexually active. She currently uses OCP (estrogen/progesterone) for contraception. She denies dyspareunia.  The patient does perform self breast exams.  There is no notable family history of breast or ovarian cancer in her family.  The patient wears seatbelts: yes.   The patient has regular exercise: not asked.    The patient denies current symptoms of depression.  Well controlled on current dose of Lexapro  Review of Systems: Review of Systems  Constitutional: Negative for chills and fever.  HENT: Negative for congestion.   Respiratory: Negative for cough and shortness of breath.   Cardiovascular: Negative for chest pain and palpitations.  Gastrointestinal: Negative for abdominal pain, constipation, diarrhea, heartburn, nausea and vomiting.  Genitourinary: Negative for dysuria, frequency and urgency.  Skin: Negative for itching and rash.  Neurological: Negative for dizziness and headaches.  Endo/Heme/Allergies: Negative for polydipsia.  Psychiatric/Behavioral: Negative for depression.    Past Medical History:  Patient Active Problem List   Diagnosis Date Noted  . Melanoma of left upper arm ([place]) 03/19/2017  . S/P cesarean section 05/03/2015    Past Surgical History:  Past Surgical History:  Procedure Laterality Date  . CESAREAN SECTION N/A 05/03/2015   Procedure: CESAREAN SECTION;  Surgeon: Malachy Mood, MD;  Location: [facility] ORS;  Service: Obstetrics;  Laterality: N/A;  . RETINAL TEAR REPAIR CRYOTHERAPY Right 2008  . TONSILLECTOMY      Gynecologic History:  Patient's last menstrual period  was 08/28/2019 (exact date). Contraception: OCP (estrogen/progesterone) Last Pap: Results were 09/28/2017: no abnormalities   Obstetric History: [id number]  Family History:  Family History  Problem Relation Age of Onset  . Healthy Mother   . Healthy Father   . Breast cancer Maternal Grandmother     Social History:  Social History   Socioeconomic History  . Marital status: Married    Spouse name: Not on file  . Number of children: Not on file  . Years of education: Not on file  . Highest education level: Not on file  Occupational History  . Not on file  Tobacco Use  . Smoking status: Never Smoker  . Smokeless tobacco: Never Used  Vaping Use  . Vaping Use: Never used  Substance and Sexual Activity  . Alcohol use: No  . Drug use: No  . Sexual activity: Yes    Birth control/protection: Pill  Other Topics Concern  . Not on file  Social History Narrative  . Not on file   Social Determinants of Health   Financial Resource Strain:   . Difficulty of Paying Living Expenses:   Food Insecurity:   . Worried About Charity fundraiser in the Last Year:   . Arboriculturist in the Last Year:   Transportation Needs:   . Film/video editor (Medical):   Marland Kitchen Lack of Transportation (Non-Medical):   Physical Activity:   . Days of Exercise per Week:   . Minutes of Exercise per Session:   Stress:   . Feeling of Stress :   Social Connections:   . Frequency of Communication with Friends and Family:   .  Frequency of Social Gatherings with Friends and Family:   . Attends Religious Services:   . Active Member of Clubs or Organizations:   . Attends Archivist Meetings:   Marland Kitchen Marital Status:   Intimate Partner Violence:   . Fear of Current or Ex-Partner:   . Emotionally Abused:   Marland Kitchen Physically Abused:   . Sexually Abused:     Allergies:  Allergies  Allergen Reactions  . Macrobid [Nitrofurantoin Monohyd Macro] Hives    Medications: Prior to Admission medications     Medication Sig Start Date End Date Taking? Authorizing Provider  escitalopram (LEXAPRO) 10 MG tablet Take 1 tablet (10 mg total) by mouth daily. 10/04/18  Yes Malachy Mood, MD  Norethindrone-Ethinyl Estradiol-Fe Biphas (LO LOESTRIN FE) 1 MG-10 MCG / 10 MCG tablet Take 1 tablet by mouth daily. 10/04/18  Yes Malachy Mood, MD    Physical Exam Vitals: Blood pressure 112/79, weight 134 lb (60.8 kg), last menstrual period 08/28/2019.  General: NAD HEENT: normocephalic, anicteric Thyroid: no enlargement, no palpable nodules Pulmonary: No increased work of breathing, CTAB Cardiovascular: RRR, distal pulses 2+ Breast: Breast symmetrical, no tenderness, no palpable nodules or masses, no skin or nipple retraction present, no nipple discharge.  No axillary or supraclavicular lymphadenopathy. Abdomen: NABS, soft, non-tender, non-distended.  Umbilicus without lesions.  No hepatomegaly, splenomegaly or masses palpable. No evidence of hernia  Genitourinary:  External: Normal external female genitalia.  Normal urethral meatus, normal Bartholin's and Skene's glands.    Vagina: Normal vaginal mucosa, no evidence of prolapse.    Cervix: Grossly normal in appearance, no bleeding  Uterus: Non-enlarged, mobile, normal contour.  No CMT  Adnexa: ovaries non-enlarged, no adnexal masses  Rectal: deferred  Lymphatic: no evidence of inguinal lymphadenopathy Extremities: no edema, erythema, or tenderness Neurologic: Grossly intact Psychiatric: mood appropriate, affect full  Female chaperone present for pelvic and breast  portions of the physical exam    Assessment: 34 y.o. G3P2011 routine annual exam  Plan: Problem List Items Addressed This Visit    None    Visit Diagnoses    Encounter for gynecological examination without abnormal finding    -  Primary   Breast screening       Screening for malignant neoplasm of cervix       Relevant Orders   Cytology - PAP (Completed)   Encounter for  preconception consultation          2) STI screening  was notoffered and therefore not obtained  2)  ASCCP guidelines and rational discussed.  Patient opts for yearly screening interval  3) Contraception - the patient is currently using  OCP (estrogen/progesterone).  She is happy with her current form of contraception and plans to continue - maybe interested in conceiving in the next year.  Discussed starting PNV month before discontinuing OCP - no additional precautions with history of melanoma other than placenta to pathology - Lexapro safe to continue during pregnancy would not discontinue  4) Routine healthcare maintenance including cholesterol, diabetes screening discussed managed by PCP   5) Return in about 1 year (around 10/09/2020) for annual.   Malachy Mood, MD, [place], [facility] 10/10/2019, 4:21 PM

## 2019-10-13 LAB — CYTOLOGY - PAP
Comment: NEGATIVE
Diagnosis: NEGATIVE
High risk HPV: NEGATIVE

## 2019-10-24 ENCOUNTER — Ambulatory Visit
Admission: EM | Admit: 2019-10-24 | Discharge: 2019-10-24 | Disposition: A | Discharge status: Home / Self Care | Payer: 59 | Attending: Internal Medicine | Admitting: Internal Medicine

## 2019-10-24 DIAGNOSIS — J029 Acute pharyngitis, unspecified: Secondary | ICD-10-CM | POA: Diagnosis not present

## 2019-10-24 LAB — GROUP A STREP BY PCR: Group A Strep by PCR: NOT DETECTED

## 2019-10-24 MED ORDER — AMOXICILLIN-POT CLAVULANATE 875-125 MG PO TABS
1.0000 | ORAL_TABLET | Freq: Two times a day (BID) | ORAL | 0 refills | Status: DC
Start: 2019-10-24 — End: 2019-10-24

## 2019-10-24 MED ORDER — IBUPROFEN 600 MG PO TABS
600.0000 mg | ORAL_TABLET | Freq: Four times a day (QID) | ORAL | 0 refills | Status: DC | PRN
Start: 2019-10-24 — End: 2019-10-24

## 2019-10-24 MED ORDER — FLUTICASONE PROPIONATE 50 MCG/ACT NA SUSP
2.0000 | Freq: Every day | NASAL | 0 refills | Status: DC
Start: 2019-10-24 — End: 2019-10-24

## 2019-10-24 NOTE — ED Provider Notes (Signed)
[facility]    CSN: [id number] Arrival date & time: 10/24/19  3557      History   Chief Complaint Chief Complaint  Patient presents with  . Sore Throat    HPI Dawn Gibbs is a 34 y.o. female comes home with a 2-day history of worsening nasal congestion, sore throat of 2 weeks duration.  Patient denies any fever or chills.  She has some difficulty with swallowing.  No neck discomfort.  No nausea or vomiting.  No fever or chills.  No loss of taste or smell.  Patient's children have had similar experiences over the past few weeks.  No chest pain or chest tightness.  Positive sick contact history.  Patient contracted COVID-19 infection in May 2021.  She has not been vaccinated against Covid 19 virus.   HPI  Past Medical History:  Diagnosis Date  . Medical history non-contributory   . Melanoma in situ of left upper arm ([place]) 11/2016  . Platelets decreased ([place])   . PONV (postoperative nausea and vomiting)     Patient Active Problem List   Diagnosis Date Noted  . Melanoma of left upper arm ([place]) 03/19/2017  . S/P cesarean section 05/03/2015    Past Surgical History:  Procedure Laterality Date  . CESAREAN SECTION N/A 05/03/2015   Procedure: CESAREAN SECTION;  Surgeon: Malachy Mood, MD;  Location: [facility] ORS;  Service: Obstetrics;  Laterality: N/A;  . RETINAL TEAR REPAIR CRYOTHERAPY Right 2008  . TONSILLECTOMY      OB History    Gravida  3   Para  2   Term  2   Preterm      AB  1   Living  1     SAB  1   TAB      Ectopic      Multiple  0   Live Births  1            Home Medications    Prior to Admission medications   Medication Sig Start Date End Date Taking? Authorizing Provider  escitalopram (LEXAPRO) 10 MG tablet Take 1 tablet (10 mg total) by mouth daily. 10/10/19  Yes Malachy Mood, MD  Multiple Vitamin (MULTIVITAMIN) tablet Take 1 tablet by mouth daily.   Yes [provider]  Norethindrone-Ethinyl Estradiol-Fe  Biphas (LO LOESTRIN FE) 1 MG-10 MCG / 10 MCG tablet Take 1 tablet by mouth daily. 10/10/19  Yes Malachy Mood, MD  fluticasone (FLONASE) 50 MCG/ACT nasal spray Place 2 sprays into both nostrils daily. 10/24/19 10/24/19  Melynda Ripple, MD    Family History Family History  Problem Relation Age of Onset  . Healthy Mother   . Healthy Father   . Breast cancer Maternal Grandmother     Social History Social History   Tobacco Use  . Smoking status: Never Smoker  . Smokeless tobacco: Never Used  Vaping Use  . Vaping Use: Never used  Substance Use Topics  . Alcohol use: No  . Drug use: No     Allergies   Macrobid [nitrofurantoin monohyd macro]   Review of Systems Review of Systems  Constitutional: Negative for activity change, chills, fatigue and fever.  HENT: Positive for congestion and sore throat. Negative for ear discharge, ear pain and facial swelling.   Respiratory: Negative for cough, shortness of breath and wheezing.   Cardiovascular: Negative.   Gastrointestinal: Negative.      Physical Exam Triage Vital Signs ED Triage Vitals  Enc Vitals Group  BP 10/24/19 1048 110/71     Pulse Rate 10/24/19 1048 89     Resp 10/24/19 1048 18     Temp 10/24/19 1048 98.5 F (36.9 C)     Temp Source 10/24/19 1048 Oral     SpO2 10/24/19 1048 100 %     Weight --      Height --      Head Circumference --      Peak Flow --      Pain Score 10/24/19 1046 2     Pain Loc --      Pain Edu? --      Excl. in [place]? --    No data found.  Updated Vital Signs BP 110/71 (BP Location: Left Arm)   Pulse 89   Temp 98.5 F (36.9 C) (Oral)   Resp 18   LMP 10/10/2019   SpO2 100%   Visual Acuity Right Eye Distance:   Left Eye Distance:   Bilateral Distance:    Right Eye Near:   Left Eye Near:    Bilateral Near:     Physical Exam Vitals and nursing note reviewed.  Constitutional:      General: She is not in acute distress.    Appearance: She is not ill-appearing.  HENT:       Right Ear: Tympanic membrane normal.     Left Ear: Tympanic membrane normal.     Nose: No congestion or rhinorrhea.     Mouth/Throat:     Mouth: Mucous membranes are moist. No oral lesions.     Pharynx: Uvula midline. Posterior oropharyngeal erythema present. No pharyngeal swelling or oropharyngeal exudate.     Tonsils: No tonsillar exudate. 0 on the right. 0 on the left.  Eyes:     Extraocular Movements:     Right eye: Normal extraocular motion.     Left eye: Normal extraocular motion.  Cardiovascular:     Rate and Rhythm: Normal rate and regular rhythm.  Pulmonary:     Effort: Pulmonary effort is normal. No respiratory distress.     Breath sounds: Normal breath sounds. No wheezing or rales.  Abdominal:     Palpations: Abdomen is soft.  Neurological:     Mental Status: She is alert.      UC Treatments / Results  Labs (all labs ordered are listed, but only abnormal results are displayed) Labs Reviewed  GROUP A STREP BY PCR    EKG   Radiology No results found.  Procedures Procedures (including critical care time)  Medications Ordered in UC Medications - No data to display  Initial Impression / Assessment and Plan / UC Course  I have reviewed the triage vital signs and the nursing notes.  Pertinent labs & imaging results that were available during my care of the patient were reviewed by me and considered in my medical decision making (see chart for details).     1.  Viral pharyngitis: Warm salt water gargle Strep PCR is negative Tylenol as needed for pain and/or fever Return to urgent care if symptoms worsen Patient was not tested for COVID-19 because she recovered from COVID-19 less than 3 months ago. Final Clinical Impressions(s) / UC Diagnoses   Final diagnoses:  Viral pharyngitis     Discharge Instructions     Warm salt water gargle Tylenol as needed for pain If symptoms worsen please return to urgent care to be reevaluated.   ED  Prescriptions    Medication Sig Dispense Auth. Provider  amoxicillin-clavulanate (AUGMENTIN) 875-125 MG tablet  (Status: Discontinued) Take 1 tablet by mouth 2 (two) times daily for 7 days. 14 tablet Melynda Ripple, MD   ibuprofen (ADVIL) 600 MG tablet  (Status: Discontinued) Take 1 tablet (600 mg total) by mouth every 6 (six) hours as needed. 30 tablet Melynda Ripple, MD   fluticasone [facility]) 50 MCG/ACT nasal spray  (Status: Discontinued) Place 2 sprays into both nostrils daily. 16 g Melynda Ripple, MD     PDMP not reviewed this encounter.   Chase Picket, MD 10/24/19 1328

## 2019-10-24 NOTE — ED Triage Notes (Signed)
Pt presents with sore throat worsening x 2 days. Had nasal congestion about two weeks ago but it went away.  No fever or other symptoms.  Two children at home are sick but neither have strep.  Pain worse with swallowing.

## 2019-10-24 NOTE — Discharge Instructions (Addendum)
Warm salt water gargle Tylenol as needed for pain If symptoms worsen please return to urgent care to be reevaluated.

## 2019-11-20 ENCOUNTER — Other Ambulatory Visit: Payer: Self-pay

## 2019-11-20 MED ORDER — LO LOESTRIN FE 1 MG-10 MCG / 10 MCG PO TABS: 1.0000 | ORAL_TABLET | Freq: Every day | ORAL | 3 refills | Status: DC

## 2019-11-20 MED ORDER — LO LOESTRIN FE 1 MG-10 MCG / 10 MCG PO TABS: 1.0000 | ORAL_TABLET | Freq: Every day | ORAL | 0 refills | Status: DC

## 2020-03-21 ENCOUNTER — Other Ambulatory Visit: Payer: Self-pay

## 2020-03-21 ENCOUNTER — Ambulatory Visit (INDEPENDENT_AMBULATORY_CARE_PROVIDER_SITE_OTHER): Payer: 59 | Admitting: Obstetrics and Gynecology

## 2020-03-21 ENCOUNTER — Other Ambulatory Visit (HOSPITAL_COMMUNITY)
Admission: RE | Admit: 2020-03-21 | Discharge: 2020-03-21 | Disposition: A | Discharge status: Home / Self Care | Payer: 59 | Source: Ambulatory Visit | Attending: Obstetrics and Gynecology | Admitting: Obstetrics and Gynecology

## 2020-03-21 ENCOUNTER — Encounter: Payer: Self-pay | Admitting: Obstetrics and Gynecology

## 2020-03-21 VITALS — BP 117/70 | HR 96 | Wt 134.0 lb

## 2020-03-21 DIAGNOSIS — Z113 Encounter for screening for infections with a predominantly sexual mode of transmission: Secondary | ICD-10-CM

## 2020-03-21 DIAGNOSIS — O09529 Supervision of elderly multigravida, unspecified trimester: Secondary | ICD-10-CM | POA: Insufficient documentation

## 2020-03-21 DIAGNOSIS — Z3689 Encounter for other specified antenatal screening: Secondary | ICD-10-CM

## 2020-03-21 DIAGNOSIS — O09299 Supervision of pregnancy with other poor reproductive or obstetric history, unspecified trimester: Secondary | ICD-10-CM

## 2020-03-21 DIAGNOSIS — Z98891 History of uterine scar from previous surgery: Secondary | ICD-10-CM

## 2020-03-21 DIAGNOSIS — Z3A01 Less than 8 weeks gestation of pregnancy: Secondary | ICD-10-CM

## 2020-03-21 DIAGNOSIS — Z862 Personal history of diseases of the blood and blood-forming organs and certain disorders involving the immune mechanism: Secondary | ICD-10-CM

## 2020-03-21 LAB — POCT URINALYSIS DIPSTICK OB
Glucose, UA: NEGATIVE
POC,PROTEIN,UA: NEGATIVE

## 2020-03-21 NOTE — Progress Notes (Signed)
NOB - RM 3

## 2020-03-21 NOTE — Progress Notes (Signed)
New Obstetric Patient H&P    Chief Complaint: "Desires prenatal care"   History of Present Illness: Patient is a 34 y.o. [id number] Not Hispanic or Latino female, presents with amenorrhea and positive home pregnancy test. Patient's last menstrual period was 02/08/2020. and based on her  LMP, her EDD is Estimated Date of Delivery: 11/14/20 and her EGA is [redacted]w[redacted]d. Cycles are regular. Her last pap smear was 10/10/2019 and was NILM HPV negative.    She had a urine pregnancy test which was positive 1 week(s)  ago. Her last menstrual period was normal. Since her LMP she claims she has experienced some nausea, fatigue, breast tenderness. She denies vaginal bleeding. Her past medical history is contibutory for a prior history of melanoma. Her prior pregnancies are notable for G1 3rd degree laceration and thrombocytopenia, G2 elective c-section due to history of birth trauma and thrombocytopenia   Since her LMP, she admits to the use of tobacco products  no There are cats in the home in the home  yes  She admits close contact with children on a regular basis  yes  She has had chicken pox in the past yes She has had Tuberculosis exposures, symptoms, or previously tested positive for TB   no Current or past history of domestic violence. no  Genetic Screening/Teratology Counseling: (Includes patient, baby's father, or anyone in either family with:)   22. Patient's age >/= 31 [facility]  yes 2. Thalassemia ([place], [place], [place], or Asian background): MCV<80  no 3. Neural tube defect (meningomyelocele, spina bifida, anencephaly)  no 4. Congenital heart defect  no  5. Down syndrome  no 6. Tay-Sachs (Jewish, [place])  no 7. Canavan's Disease  no 8. Sickle cell disease or trait (African)  no  9. Hemophilia or other blood disorders  no  10. Muscular dystrophy  no  11. Cystic fibrosis  no  12. Huntington's Chorea  no  13. Mental retardation/autism  no 14. Other inherited genetic or chromosomal  disorder  no 15. Maternal metabolic disorder (DM, PKU, etc)  no 16. Patient or FOB with a child with a birth defect not listed above no  16a. Patient or FOB with a birth defect themselves no 17. Recurrent pregnancy loss, or stillbirth  no  18. Any medications since LMP other than prenatal vitamins (include vitamins, supplements, OTC meds, drugs, alcohol)  no 19. Any other genetic/environmental exposure to discuss  no  Infection History:   1. Lives with someone with TB or TB exposed  no  2. Patient or partner has history of genital herpes  no 3. Rash or viral illness since LMP  no 4. History of STI (GC, CT, HPV, syphilis, HIV)  no 5. History of recent travel :  no  Other pertinent information:  no     Review of Systems:10 point review of systems negative unless otherwise noted in HPI  Past Medical History:  Patient Active Problem List   Diagnosis Date Noted  . Melanoma of left upper arm ([place]) 03/19/2017  . S/P cesarean section 05/03/2015    Past Surgical History:  Past Surgical History:  Procedure Laterality Date  . CESAREAN SECTION N/A 05/03/2015   Procedure: CESAREAN SECTION;  Surgeon: Malachy Mood, MD;  Location: [facility] ORS;  Service: Obstetrics;  Laterality: N/A;  . RETINAL TEAR REPAIR CRYOTHERAPY Right 2008  . TONSILLECTOMY      Gynecologic History: Patient's last menstrual period was 02/08/2020.  Obstetric History: [id number]  Family History:  Family History  Problem  Relation Age of Onset  . Healthy Mother   . Healthy Father   . Breast cancer Maternal Grandmother     Social History:  Social History   Socioeconomic History  . Marital status: Married    Spouse name: Not on file  . Number of children: Not on file  . Years of education: Not on file  . Highest education level: Not on file  Occupational History  . Not on file  Tobacco Use  . Smoking status: Never Smoker  . Smokeless tobacco: Never Used  Vaping Use  . Vaping Use: Never used  Substance and  Sexual Activity  . Alcohol use: No  . Drug use: No  . Sexual activity: Yes    Birth control/protection: Pill  Other Topics Concern  . Not on file  Social History Narrative  . Not on file   Social Determinants of Health   Financial Resource Strain:   . Difficulty of Paying Living Expenses: Not on file  Food Insecurity:   . Worried About Charity fundraiser in the Last Year: Not on file  . Ran Out of Food in the Last Year: Not on file  Transportation Needs:   . Lack of Transportation (Medical): Not on file  . Lack of Transportation (Non-Medical): Not on file  Physical Activity:   . Days of Exercise per Week: Not on file  . Minutes of Exercise per Session: Not on file  Stress:   . Feeling of Stress : Not on file  Social Connections:   . Frequency of Communication with Friends and Family: Not on file  . Frequency of Social Gatherings with Friends and Family: Not on file  . Attends Religious Services: Not on file  . Active Member of Clubs or Organizations: Not on file  . Attends Archivist Meetings: Not on file  . Marital Status: Not on file  Intimate Partner Violence:   . Fear of Current or Ex-Partner: Not on file  . Emotionally Abused: Not on file  . Physically Abused: Not on file  . Sexually Abused: Not on file    Allergies:  Allergies  Allergen Reactions  . Nickel Rash  . Macrobid [Nitrofurantoin Monohyd Macro] Hives    Medications: Prior to Admission medications   Medication Sig Start Date End Date Taking? Authorizing Provider  escitalopram (LEXAPRO) 10 MG tablet Take 1 tablet (10 mg total) by mouth daily. 10/10/19  Yes Malachy Mood, MD  Prenatal Vit-Fe Fumarate-FA (MULTIVITAMIN-PRENATAL) 27-0.8 MG TABS tablet Take 1 tablet by mouth daily at 12 noon.   Yes [provider]  Multiple Vitamin (MULTIVITAMIN) tablet Take 1 tablet by mouth daily. Patient not taking: Reported on 03/21/2020    [provider]  Norethindrone-Ethinyl  Estradiol-Fe Biphas (LO LOESTRIN FE) 1 MG-10 MCG / 10 MCG tablet Take 1 tablet by mouth daily. Patient not taking: Reported on 03/21/2020 11/20/19   Malachy Mood, MD  fluticasone [facility]) 50 MCG/ACT nasal spray Place 2 sprays into both nostrils daily. 10/24/19 10/24/19  Melynda Ripple, MD    Physical Exam Vitals: Blood pressure 117/70, pulse 96, weight 134 lb (60.8 kg), last menstrual period 02/08/2020.  General: NAD HEENT: normocephalic, anicteric Thyroid: no enlargement, no palpable nodules Pulmonary: No increased work of breathing, CTAB Cardiovascular: RRR, distal pulses 2+ Abdomen: NABS, soft, non-tender, non-distended.  Umbilicus without lesions.  No hepatomegaly, splenomegaly or masses palpable. No evidence of hernia  Genitourinary:  External: Normal external female genitalia.  Normal urethral meatus, normal  Bartholin's and  Skene's glands.    Vagina: Normal vaginal mucosa, no evidence of prolapse.    Cervix: Grossly normal in appearance, no bleeding  Uterus:  Non-enlarged, mobile, normal contour.  No CMT  Adnexa: ovaries non-enlarged, no adnexal masses  Rectal: deferred Extremities: no edema, erythema, or tenderness Neurologic: Grossly intact Psychiatric: mood appropriate, affect full   Assessment: 34 y.o. G4P2011 at [redacted]w[redacted]d presenting to initiate prenatal care  Plan: 1) Avoid alcoholic beverages. 2) Patient encouraged not to smoke.  3) Discontinue the use of all non-medicinal drugs and chemicals.  4) Take prenatal vitamins daily.  5) Nutrition, food safety (fish, cheese advisories, and high nitrite foods) and exercise discussed. 6) Hospital and practice style discussed with cross coverage system.  7) Genetic Screening, such as with 1st Trimester Screening, cell free fetal DNA, AFP testing, and Ultrasound, as well as with amniocentesis and CVS as appropriate, is discussed with patient. At the conclusion of today's visit patient requested genetic testing 8) Return in about 1  week (around 03/28/2020) for ROB and dating scan.   Malachy Mood, MD, [facility], [facility] 03/21/2020, 3:53 PM

## 2020-03-22 ENCOUNTER — Other Ambulatory Visit: Payer: Self-pay | Admitting: Obstetrics and Gynecology

## 2020-03-22 LAB — RPR+RH+ABO+RUB AB+AB SCR+CB...
Antibody Screen: NEGATIVE
HIV Screen 4th Generation wRfx: NONREACTIVE
Hematocrit: 36.1 % (ref 34.0–46.6)
Hemoglobin: 12.8 g/dL (ref 11.1–15.9)
Hepatitis B Surface Ag: NEGATIVE
MCH: 34.1 pg — ABNORMAL HIGH (ref 26.6–33.0)
MCHC: 35.5 g/dL (ref 31.5–35.7)
MCV: 96 fL (ref 79–97)
Platelets: 172 10*3/uL (ref 150–450)
RBC: 3.75 x10E6/uL — ABNORMAL LOW (ref 3.77–5.28)
RDW: 11.8 % (ref 11.7–15.4)
RPR Ser Ql: NONREACTIVE
Rh Factor: NEGATIVE
Rubella Antibodies, IGG: 8.37 index (ref >0.99)
Varicella zoster IgG: 2188 index (ref >165)
WBC: 8 10*3/uL (ref 3.4–10.8)

## 2020-03-22 MED ORDER — DOXYLAMINE-PYRIDOXINE 10-10 MG PO TBEC: 2.0000 | DELAYED_RELEASE_TABLET | Freq: Every day | ORAL | 5 refills | Status: DC

## 2020-03-23 LAB — URINE CULTURE

## 2020-03-25 LAB — CERVICOVAGINAL ANCILLARY ONLY
Chlamydia: NEGATIVE
Comment: NEGATIVE
Comment: NORMAL
Neisseria Gonorrhea: NEGATIVE

## 2020-03-29 ENCOUNTER — Ambulatory Visit (INDEPENDENT_AMBULATORY_CARE_PROVIDER_SITE_OTHER): Payer: 59 | Admitting: Obstetrics and Gynecology

## 2020-03-29 ENCOUNTER — Other Ambulatory Visit: Payer: Self-pay

## 2020-03-29 ENCOUNTER — Ambulatory Visit (INDEPENDENT_AMBULATORY_CARE_PROVIDER_SITE_OTHER): Payer: 59

## 2020-03-29 ENCOUNTER — Other Ambulatory Visit: Payer: Self-pay | Admitting: Obstetrics and Gynecology

## 2020-03-29 VITALS — BP 110/60 | Wt 136.0 lb

## 2020-03-29 DIAGNOSIS — O09529 Supervision of elderly multigravida, unspecified trimester: Secondary | ICD-10-CM | POA: Diagnosis not present

## 2020-03-29 DIAGNOSIS — Z3689 Encounter for other specified antenatal screening: Secondary | ICD-10-CM

## 2020-03-29 DIAGNOSIS — Z3A01 Less than 8 weeks gestation of pregnancy: Secondary | ICD-10-CM

## 2020-03-29 DIAGNOSIS — Z98891 History of uterine scar from previous surgery: Secondary | ICD-10-CM

## 2020-03-29 DIAGNOSIS — Z862 Personal history of diseases of the blood and blood-forming organs and certain disorders involving the immune mechanism: Secondary | ICD-10-CM

## 2020-03-29 DIAGNOSIS — O09299 Supervision of pregnancy with other poor reproductive or obstetric history, unspecified trimester: Secondary | ICD-10-CM

## 2020-03-29 LAB — POCT URINALYSIS DIPSTICK OB
Glucose, UA: NEGATIVE
POC,PROTEIN,UA: NEGATIVE

## 2020-03-29 NOTE — Progress Notes (Signed)
ROB - Dating scan, no concerns. RM 4

## 2020-03-29 NOTE — Progress Notes (Signed)
    Routine Prenatal Care Visit  Subjective  Dawn Gibbs is a 34 y.o. [phone] at [redacted]w[redacted]d being seen today for ongoing prenatal care.  She is currently monitored for the following issues for this low-risk pregnancy and has History of cesarean section; Melanoma of left upper arm ([place]); History of decreased platelet count; History of maternal third degree perineal laceration, currently pregnant; and Supervision of multigravida of advanced maternal age, antepartum on their problem list.  ----------------------------------------------------------------------------------- Patient reports no complaints other than nausea so far covered by [facility] Contractions: Not present. Vag. Bleeding: None.  Movement: Absent. Denies leaking of fluid.  ----------------------------------------------------------------------------------- The following portions of the patient's history were reviewed and updated as appropriate: allergies, current medications, past family history, past medical history, past social history, past surgical history and problem list. Problem list updated.   Objective  Blood pressure 110/60, weight 136 lb (61.7 kg), last menstrual period 02/08/2020. Pregravid weight 135 lb (61.2 kg) Total Weight Gain 1 lb (0.454 kg) Urinalysis:      Fetal Status: Fetal Heart Rate (bpm): present   Movement: Absent     General:  Alert, oriented and cooperative. Patient is in no acute distress.  Skin: Skin is warm and dry. No rash noted.   Cardiovascular: Normal heart rate noted  Respiratory: Normal respiratory effort, no problems with respiration noted  Abdomen: Soft, gravid, appropriate for gestational age. Pain/Pressure: Absent     Pelvic:  Cervical exam deferred        Extremities: Normal range of motion.     ental Status: Normal mood and affect. Normal behavior. Normal judgment and thought content.   US OB Transvaginal  Result Date: 03/29/2020 Patient Name: Dawn Gibbs DOB: 1985/11/01 MRN:  [id number] ULTRASOUND REPORT Location[facility] Date of Service: 03/29/2020 Indications:dating Findings: Dawn Gibbs intrauterine pregnancy is visualized with a CRL consistent with [redacted]w[redacted]d gestation, giving an (U/S) EDD of 11/14/2020. The (U/S) EDD is consistent with the clinically established EDD of 11/14/2020. FHR: 129 BPM CRL measurement: 10.4 mm Yolk sac is visualized and appears normal. Amnion: visualized and appears normal Right Ovary is normal in appearance. Left Ovary is normal appearance. Corpus luteal cyst:  Left ovary Survey of the adnexa demonstrates no adnexal masses. There is no free peritoneal fluid in the cul de sac. Impression: 1. [redacted]w[redacted]d Viable Singleton Intrauterine pregnancy by U/S. 2. (U/S) EDD is consistent with Clinically established EDD of 11/14/2020. Recommendations: 1.Clinical correlation with the patient's History and Physical Exam. Gweneth Dimitri, RT There is a viable singleton gestation.  The fetal biometry correlates with established dating. Detailed evaluation of the fetal anatomy is precluded by early gestational age.  It must be noted that a normal ultrasound particular at this early gestational age is unable to rule out fetal aneuploidy, risk of first trimester miscarriage, or anatomic birth defects. Malachy Mood, MD, [facility], [facility] 03/29/2020, 11:19 AM     Assessment   34 y.o. [id number] at [redacted]w[redacted]d by  11/14/2020, by Last Menstrual Period presenting for routine prenatal visit  Plan    Gestational age appropriate obstetric precautions including but not limited to vaginal bleeding, contractions, leaking of fluid and fetal movement were reviewed in detail with the patient.    S=D on scan today, genetic next visit  Return in about 3 weeks (around 04/19/2020) for on or after 12/31 for ROB and genetics.  Malachy Mood, MD, [facility], [facility] 03/29/2020, 1:23 PM

## 2020-04-08 ENCOUNTER — Other Ambulatory Visit: Payer: Self-pay | Admitting: Obstetrics and Gynecology

## 2020-04-08 MED ORDER — ONDANSETRON 4 MG PO TBDP
4.0000 mg | ORAL_TABLET | Freq: Four times a day (QID) | ORAL | 0 refills | Status: DC | PRN
Start: 2020-04-08 — End: 2020-10-16

## 2020-04-22 ENCOUNTER — Ambulatory Visit (INDEPENDENT_AMBULATORY_CARE_PROVIDER_SITE_OTHER): Payer: 59 | Admitting: Obstetrics and Gynecology

## 2020-04-22 ENCOUNTER — Other Ambulatory Visit: Payer: Self-pay

## 2020-04-22 ENCOUNTER — Other Ambulatory Visit: Payer: 59

## 2020-04-22 VITALS — BP 110/68 | Wt 140.1 lb

## 2020-04-22 DIAGNOSIS — Z1379 Encounter for other screening for genetic and chromosomal anomalies: Secondary | ICD-10-CM

## 2020-04-22 DIAGNOSIS — Z369 Encounter for antenatal screening, unspecified: Secondary | ICD-10-CM

## 2020-04-22 DIAGNOSIS — Z98891 History of uterine scar from previous surgery: Secondary | ICD-10-CM

## 2020-04-22 DIAGNOSIS — Z31438 Encounter for other genetic testing of female for procreative management: Secondary | ICD-10-CM

## 2020-04-22 DIAGNOSIS — Z862 Personal history of diseases of the blood and blood-forming organs and certain disorders involving the immune mechanism: Secondary | ICD-10-CM

## 2020-04-22 DIAGNOSIS — O09529 Supervision of elderly multigravida, unspecified trimester: Secondary | ICD-10-CM

## 2020-04-22 NOTE — Progress Notes (Signed)
    Routine Prenatal Care Visit  Subjective  Dawn Gibbs is a 35 y.o. [phone] at [redacted]w[redacted]d being seen today for ongoing prenatal care.  She is currently monitored for the following issues for this high-risk pregnancy and has History of cesarean section; Melanoma of left upper arm ([facility]); History of decreased platelet count; History of maternal third degree perineal laceration, currently pregnant; and Supervision of multigravida of advanced maternal age, antepartum on their problem list.  ----------------------------------------------------------------------------------- Patient reports no complaints.   Contractions: Not present. Vag. Bleeding: None.  Movement: Absent. Denies leaking of fluid.  ----------------------------------------------------------------------------------- The following portions of the patient's history were reviewed and updated as appropriate: allergies, current medications, past family history, past medical history, past social history, past surgical history and problem list. Problem list updated.   Objective  Blood pressure 110/68, weight 140 lb 2 oz (63.6 kg), last menstrual period 02/08/2020. Pregravid weight 135 lb (61.2 kg) Total Weight Gain 5 lb 2 oz (2.325 kg) Urinalysis:      Fetal Status: Fetal Heart Rate (bpm): 167   Movement: Absent     General:  Alert, oriented and cooperative. Patient is in no acute distress.  Skin: Skin is warm and dry. No rash noted.   Cardiovascular: Normal heart rate noted  Respiratory: Normal respiratory effort, no problems with respiration noted  Abdomen: Soft, gravid, appropriate for gestational age. Pain/Pressure: Absent     Pelvic:  Cervical exam deferred        Extremities: Normal range of motion.     ental Status: Normal mood and affect. Normal behavior. Normal judgment and thought content.     Assessment   35 y.o. [id number] at [redacted]w[redacted]d by  11/14/2020, by Last Menstrual Period presenting for routine prenatal visit  Plan    Gestational age appropriate obstetric precautions including but not limited to vaginal bleeding, contractions, leaking of fluid and fetal movement were reviewed in detail with the patient.    Return in about 4 weeks (around 05/20/2020) for ROB .  Vena Austria, MD, [facility], [facility] 04/22/2020, 10:54 AM

## 2020-04-27 LAB — MATERNIT 21 PLUS CORE, BLOOD
Fetal Fraction: 13
Result (T21): NEGATIVE
Trisomy 13 (Patau syndrome): NEGATIVE
Trisomy 18 (Edwards syndrome): NEGATIVE
Trisomy 21 (Down syndrome): NEGATIVE

## 2020-05-03 LAB — INHERITEST CORE(CF97,SMA,FRAX)

## 2020-05-21 ENCOUNTER — Other Ambulatory Visit: Payer: Self-pay

## 2020-05-21 ENCOUNTER — Ambulatory Visit (INDEPENDENT_AMBULATORY_CARE_PROVIDER_SITE_OTHER): Payer: 59 | Admitting: Obstetrics and Gynecology

## 2020-05-21 VITALS — BP 110/58 | Wt 146.0 lb

## 2020-05-21 DIAGNOSIS — O09529 Supervision of elderly multigravida, unspecified trimester: Secondary | ICD-10-CM

## 2020-05-21 DIAGNOSIS — O09299 Supervision of pregnancy with other poor reproductive or obstetric history, unspecified trimester: Secondary | ICD-10-CM

## 2020-05-21 DIAGNOSIS — Z98891 History of uterine scar from previous surgery: Secondary | ICD-10-CM

## 2020-05-21 DIAGNOSIS — Z363 Encounter for antenatal screening for malformations: Secondary | ICD-10-CM

## 2020-05-21 DIAGNOSIS — Z3A14 14 weeks gestation of pregnancy: Secondary | ICD-10-CM

## 2020-05-21 LAB — POCT URINALYSIS DIPSTICK OB
Glucose, UA: NEGATIVE
POC,PROTEIN,UA: NEGATIVE

## 2020-05-21 NOTE — Progress Notes (Signed)
Routine Prenatal Care Visit  Subjective  Dawn Gibbs is a 35 y.o. [phone] at 102w5d being seen today for ongoing prenatal care.  She is currently monitored for the following issues for this low-risk pregnancy and has History of cesarean section; History of decreased platelet count; History of maternal third degree perineal laceration, currently pregnant; and Supervision of multigravida of advanced maternal age, antepartum on their problem list.  ----------------------------------------------------------------------------------- Patient reports no complaints.  Is having some occasional headaches and has started OTC magnesium supplements Contractions: Not present. Vag. Bleeding: None.  Movement: Absent. Denies leaking of fluid.  ----------------------------------------------------------------------------------- The following portions of the patient's history were reviewed and updated as appropriate: allergies, current medications, past family history, past medical history, past social history, past surgical history and problem list. Problem list updated.   Objective  Blood pressure (!) 110/58, weight 146 lb (66.2 kg), last menstrual period 02/08/2020.. Pregravid weight 135 lb (61.2 kg) Total Weight Gain 11 lb (4.99 kg) Urinalysis:      Fetal Status: Fetal Heart Rate (bpm): 155   Movement: Absent     General:  Alert, oriented and cooperative. Patient is in no acute distress.  Skin: Skin is warm and dry. No rash noted.   Cardiovascular: Normal heart rate noted  Respiratory: Normal respiratory effort, no problems with respiration noted  Abdomen: Soft, gravid, appropriate for gestational age. Pain/Pressure: Absent     Pelvic:  Cervical exam deferred        Extremities: Normal range of motion.     ental Status: Normal mood and affect. Normal behavior. Normal judgment and thought content.     Assessment   35 y.o. [id number] at [redacted]w[redacted]d by  11/14/2020, by Last Menstrual Period presenting for  routine prenatal visit  Plan   pregnancy 4 Problems (from 03/21/20 to present)    Problem Noted Resolved   History of maternal third degree perineal laceration, currently pregnant 03/21/2020 by Malachy Mood, MD No   Supervision of multigravida of advanced maternal age, antepartum 03/21/2020 by Malachy Mood, MD No   Overview Addendum 05/03/2020 11:46 AM by Malachy Mood, MD    [facility] Prenatal Labs  Dating LMP = 7 week [place] Blood type: O/Negative/-- (12/02 1618)   Genetic Screen -NIPS: Normal XY CF neg, Fragile-X neg, SMA neg Antibody:Negative (12/02 1618)  Anatomic [place]  Rubella: 8.37 (12/02 1618) Varicella: Immune  GTT Third trimester:  RPR: Non Reactive (12/02 1618)   Rhogam [ ]  28 weeks HBsAg: Negative (12/02 1618)   TDaP vaccine                       Flu Shot: HIV: Non Reactive (12/02 1618)   Baby Food                                GBS:   Contraception  Pap: 10/10/2019 NILM HVP negative  CBB     CS/VBAC [ ]  repeat C-section   Support Person Husband [name]           Previous Version      Gestational age appropriate obstetric precautions including but not limited to vaginal bleeding, contractions, leaking of fluid and fetal movement were reviewed in detail with the patient.    1) Headaches - discussed addition of compazine if needed.   Return in about 4 weeks (around 06/18/2020) for ROB and anatomy scan.  Malachy Mood, MD, [facility],  [facility] 05/21/2020, 8:43 AM

## 2020-05-21 NOTE — Addendum Note (Signed)
Addended by: Tamala Julian R on: 05/21/2020 09:58 AM   Modules accepted: Orders

## 2020-06-18 ENCOUNTER — Ambulatory Visit (INDEPENDENT_AMBULATORY_CARE_PROVIDER_SITE_OTHER): Payer: 59

## 2020-06-18 ENCOUNTER — Other Ambulatory Visit: Payer: Self-pay

## 2020-06-18 ENCOUNTER — Ambulatory Visit (INDEPENDENT_AMBULATORY_CARE_PROVIDER_SITE_OTHER): Payer: 59 | Admitting: Obstetrics and Gynecology

## 2020-06-18 VITALS — BP 110/58 | Wt 151.0 lb

## 2020-06-18 DIAGNOSIS — O09529 Supervision of elderly multigravida, unspecified trimester: Secondary | ICD-10-CM | POA: Diagnosis not present

## 2020-06-18 DIAGNOSIS — Z363 Encounter for antenatal screening for malformations: Secondary | ICD-10-CM | POA: Diagnosis not present

## 2020-06-18 DIAGNOSIS — Z3A18 18 weeks gestation of pregnancy: Secondary | ICD-10-CM

## 2020-06-18 DIAGNOSIS — Z98891 History of uterine scar from previous surgery: Secondary | ICD-10-CM

## 2020-06-18 DIAGNOSIS — O09299 Supervision of pregnancy with other poor reproductive or obstetric history, unspecified trimester: Secondary | ICD-10-CM

## 2020-06-18 LAB — POCT URINALYSIS DIPSTICK OB
Glucose, UA: NEGATIVE
POC,PROTEIN,UA: NEGATIVE

## 2020-06-18 NOTE — Progress Notes (Signed)
Routine Prenatal Care Visit  Subjective  Dawn Gibbs is a 35 y.o. [phone] at [redacted]w[redacted]d being seen today for ongoing prenatal care.  She is currently monitored for the following issues for this low-risk pregnancy and has History of cesarean section; History of decreased platelet count; History of maternal third degree perineal laceration, currently pregnant; and Supervision of multigravida of advanced maternal age, antepartum on their problem list.  ----------------------------------------------------------------------------------- Patient reports no complaints.    .  .   . Denies leaking of fluid.  ----------------------------------------------------------------------------------- The following portions of the patient's history were reviewed and updated as appropriate: allergies, current medications, past family history, past medical history, past social history, past surgical history and problem list. Problem list updated.   Objective  Blood pressure (!) 110/58, weight 151 lb (68.5 kg), last menstrual period 02/08/2020. Pregravid weight 135 lb (61.2 kg) Total Weight Gain 16 lb (7.258 kg) Urinalysis:      Fetal Status:           General:  Alert, oriented and cooperative. Patient is in no acute distress.  Skin: Skin is warm and dry. No rash noted.   Cardiovascular: Normal heart rate noted  Respiratory: Normal respiratory effort, no problems with respiration noted  Abdomen: Soft, gravid, appropriate for gestational age.       Pelvic:  Cervical exam deferred        Extremities: Normal range of motion.     ental Status: Normal mood and affect. Normal behavior. Normal judgment and thought content.   US OB Comp + 14 Wk  Result Date: 06/18/2020 Patient Name: Terah Robey DOB: [birth year] MRN: [id number] ULTRASOUND REPORT Location: [facility] Date of Service: 06/18/2020 Indications:Anatomy Ultrasound Findings: Nelda Marseille intrauterine pregnancy is visualized with FHR at 148 BPM.  Biometrics give an (U/S) Gestational age of [redacted]w[redacted]d and an (U/S) EDD of 11/10/20; this correlates with the clinically established Estimated Date of Delivery: 11/14/20 Fetal presentation is Breech. EFW: 280g (10oz). Placenta: anterior. Grade: 1. Cervix: 3.4cm. AFI: subjectively normal. Anatomic survey is complete; Gender - female.  Bilateral choroid plexus cysts. Right Ovary: is normal in appearance. Left Ovary: is normal appearance. Survey of the adnexa demonstrates no adnexal masses. There is no free peritoneal fluid in the cul de sac. Impression: 1. [redacted]w[redacted]d Viable Singleton Intrauterine pregnancy by U/S. 2. (U/S) EDD is consistent with Clinically established Estimated Date of Delivery: 11/14/20 . 3. Bilateral choroid plexus cyst, otherwise normal Anatomy Scan Recommendations: 1.Clinical correlation with the patient's History and Physical Exam. Vita Barley, RT  There is a singleton gestation with subjectively normal amniotic fluid volume. The fetal biometry correlates with established dating. Detailed evaluation of the fetal anatomy was performed.The fetal anatomical survey appears within normal limits within the resolution of ultrasound as described above.  Isolated choroid pelxus cyst may be seen in 1-2% of second trimester pregnancies.  The patient has had prior cell free fetal DNA testing which was normal.  It must be noted that a normal ultrasound is unable to rule out fetal aneuploidy, subtle defects such as small ASD or VDS may also not be visible on imaging.  Malachy Mood, MD, [facility], [facility] 06/18/2020, 4:18 PM  Assessment   35 y.o. [id number] at [redacted]w[redacted]d by  11/14/2020, by Last Menstrual Period presenting for routine prenatal visit  Plan   pregnancy 4 Problems (from 03/21/20 to present)    Problem Noted Resolved   History of maternal third degree perineal laceration, currently pregnant 03/21/2020  by Malachy Mood, MD No   Supervision of multigravida of advanced maternal  age, antepartum 03/21/2020 by Malachy Mood, MD No   Overview Addendum 05/03/2020 11:46 AM by Malachy Mood, MD    [facility] Prenatal Labs  Dating LMP = 7 week [place] Blood type: O/Negative/-- (12/02 1618)   Genetic Screen -NIPS: Normal XY CF neg, Fragile-X neg, SMA neg Antibody:Negative (12/02 1618)  Anatomic [place]  Rubella: 8.37 (12/02 1618) Varicella: Immune  GTT Third trimester:  RPR: Non Reactive (12/02 1618)   Rhogam [ ]  28 weeks HBsAg: Negative (12/02 1618)   TDaP vaccine                       Flu Shot: HIV: Non Reactive (12/02 1618)   Baby Food                                GBS:   Contraception  Pap: 10/10/2019 NILM HVP negative  CBB     CS/VBAC [ ]  repeat C-section   Support Person Husband [name]           Previous Version       Gestational age appropriate obstetric precautions including but not limited to vaginal bleeding, contractions, leaking of fluid and fetal movement were reviewed in detail with the patient.    Return in about 4 weeks (around 07/16/2020) for ROB.  Malachy Mood, MD, [facility], [facility] 06/18/2020, 4:38 PM

## 2020-07-19 ENCOUNTER — Ambulatory Visit (INDEPENDENT_AMBULATORY_CARE_PROVIDER_SITE_OTHER): Payer: 59 | Admitting: Obstetrics and Gynecology

## 2020-07-19 ENCOUNTER — Other Ambulatory Visit: Payer: Self-pay

## 2020-07-19 ENCOUNTER — Encounter: Payer: Self-pay | Admitting: Obstetrics and Gynecology

## 2020-07-19 VITALS — BP 110/60 | Wt 157.0 lb

## 2020-07-19 DIAGNOSIS — O09299 Supervision of pregnancy with other poor reproductive or obstetric history, unspecified trimester: Secondary | ICD-10-CM

## 2020-07-19 DIAGNOSIS — Z862 Personal history of diseases of the blood and blood-forming organs and certain disorders involving the immune mechanism: Secondary | ICD-10-CM

## 2020-07-19 DIAGNOSIS — Z98891 History of uterine scar from previous surgery: Secondary | ICD-10-CM

## 2020-07-19 DIAGNOSIS — Z3A23 23 weeks gestation of pregnancy: Secondary | ICD-10-CM

## 2020-07-19 DIAGNOSIS — O09529 Supervision of elderly multigravida, unspecified trimester: Secondary | ICD-10-CM

## 2020-07-19 DIAGNOSIS — Z8669 Personal history of other diseases of the nervous system and sense organs: Secondary | ICD-10-CM

## 2020-07-19 DIAGNOSIS — Z369 Encounter for antenatal screening, unspecified: Secondary | ICD-10-CM

## 2020-07-19 LAB — POCT URINALYSIS DIPSTICK OB
Glucose, UA: NEGATIVE
POC,PROTEIN,UA: NEGATIVE

## 2020-07-19 NOTE — Addendum Note (Signed)
Addended by: Tamala Julian R on: 07/19/2020 09:07 AM   Modules accepted: Orders

## 2020-07-19 NOTE — Progress Notes (Signed)
    Routine Prenatal Care Visit  Subjective  Dawn Gibbs is a 35 y.o. [phone] at [redacted]w[redacted]d being seen today for ongoing prenatal care.  She is currently monitored for the following issues for this low-risk pregnancy and has History of cesarean section; History of decreased platelet count; History of maternal third degree perineal laceration, currently pregnant; Supervision of multigravida of advanced maternal age, antepartum; and History of retinal tear on their problem list.  ----------------------------------------------------------------------------------- Patient reports no complaints.   Contractions: Not present. Vag. Bleeding: None.  Movement: Present. Denies leaking of fluid.  ----------------------------------------------------------------------------------- The following portions of the patient's history were reviewed and updated as appropriate: allergies, current medications, past family history, past medical history, past social history, past surgical history and problem list. Problem list updated.   Objective  Blood pressure 110/60, weight 157 lb (71.2 kg), last menstrual period 02/08/2020. Pregravid weight 135 lb (61.2 kg) Total Weight Gain 22 lb (9.979 kg) Urinalysis:      Fetal Status: Fetal Heart Rate (bpm): 155 Fundal Height: 23 cm Movement: Present     General:  Alert, oriented and cooperative. Patient is in no acute distress.  Skin: Skin is warm and dry. No rash noted.   Cardiovascular: Normal heart rate noted  Respiratory: Normal respiratory effort, no problems with respiration noted  Abdomen: Soft, gravid, appropriate for gestational age. Pain/Pressure: Absent     Pelvic:  Cervical exam deferred        Extremities: Normal range of motion.     ental Status: Normal mood and affect. Normal behavior. Normal judgment and thought content.     Assessment   35 y.o. [id number] at [redacted]w[redacted]d by  11/14/2020, by Last Menstrual Period presenting for routine prenatal visit  Plan    pregnancy 4 Problems (from 03/21/20 to present)    Problem Noted Resolved   History of maternal third degree perineal laceration, currently pregnant 03/21/2020 by Malachy Mood, MD No   Supervision of multigravida of advanced maternal age, antepartum 03/21/2020 by Malachy Mood, MD No   Overview Addendum 05/03/2020 11:46 AM by Malachy Mood, MD    [facility] Prenatal Labs  Dating LMP = 7 week [place] Blood type: O/Negative/-- (12/02 1618)   Genetic Screen -NIPS: Normal XY CF neg, Fragile-X neg, SMA neg Antibody:Negative (12/02 1618)  Anatomic [place]  Rubella: 8.37 (12/02 1618) Varicella: Immune  GTT Third trimester:  RPR: Non Reactive (12/02 1618)   Rhogam [ ]  28 weeks HBsAg: Negative (12/02 1618)   TDaP vaccine                       Flu Shot: HIV: Non Reactive (12/02 1618)   Baby Food                                GBS:   Contraception  Pap: 10/10/2019 NILM HVP negative  CBB     CS/VBAC [ ]  repeat C-section   Support Person Husband [name]           Previous Version       Gestational age appropriate obstetric precautions including but not limited to vaginal bleeding, contractions, leaking of fluid and fetal movement were reviewed in detail with the patient.    Return in about 4 weeks (around 08/16/2020) for ROB and 28 week labs.  Malachy Mood, MD, [facility], [facility] 07/19/2020, 8:37 AM

## 2020-08-20 ENCOUNTER — Other Ambulatory Visit: Payer: Self-pay

## 2020-08-20 ENCOUNTER — Other Ambulatory Visit: Payer: 59

## 2020-08-20 ENCOUNTER — Ambulatory Visit (INDEPENDENT_AMBULATORY_CARE_PROVIDER_SITE_OTHER): Payer: 59 | Admitting: Obstetrics and Gynecology

## 2020-08-20 VITALS — BP 110/60 | Wt 163.0 lb

## 2020-08-20 DIAGNOSIS — Z369 Encounter for antenatal screening, unspecified: Secondary | ICD-10-CM

## 2020-08-20 DIAGNOSIS — O26893 Other specified pregnancy related conditions, third trimester: Secondary | ICD-10-CM

## 2020-08-20 DIAGNOSIS — Z6791 Unspecified blood type, Rh negative: Secondary | ICD-10-CM | POA: Diagnosis not present

## 2020-08-20 DIAGNOSIS — Z98891 History of uterine scar from previous surgery: Secondary | ICD-10-CM

## 2020-08-20 DIAGNOSIS — O09299 Supervision of pregnancy with other poor reproductive or obstetric history, unspecified trimester: Secondary | ICD-10-CM

## 2020-08-20 DIAGNOSIS — Z862 Personal history of diseases of the blood and blood-forming organs and certain disorders involving the immune mechanism: Secondary | ICD-10-CM

## 2020-08-20 DIAGNOSIS — O09529 Supervision of elderly multigravida, unspecified trimester: Secondary | ICD-10-CM

## 2020-08-20 DIAGNOSIS — Z3A27 27 weeks gestation of pregnancy: Secondary | ICD-10-CM

## 2020-08-20 LAB — POCT URINALYSIS DIPSTICK OB: POC,PROTEIN,UA: NEGATIVE

## 2020-08-20 MED ORDER — RHO D IMMUNE GLOBULIN 1500 UNIT/2ML IJ SOSY
300.0000 ug | PREFILLED_SYRINGE | Freq: Once | INTRAMUSCULAR | Status: AC
  Administered 2020-08-20: 300 ug via INTRAMUSCULAR

## 2020-08-20 NOTE — Progress Notes (Signed)
Routine Prenatal Care Visit  Subjective  Dawn Gibbs is a 35 y.o. [id number] at [redacted]w[redacted]d being seen today for ongoing prenatal care.  She is currently monitored for the following issues for this low-risk pregnancy and has History of cesarean section; History of decreased platelet count; History of maternal third degree perineal laceration, currently pregnant; Supervision of multigravida of advanced maternal age, antepartum; and History of retinal tear on their problem list.  ----------------------------------------------------------------------------------- Patient reports no complaints.   Contractions: Not present. Vag. Bleeding: None.  Movement: Present. Denies leaking of fluid.  ----------------------------------------------------------------------------------- The following portions of the patient's history were reviewed and updated as appropriate: allergies, current medications, past family history, past medical history, past social history, past surgical history and problem list. Problem list updated.   Objective  Blood pressure 110/60, weight 163 lb (73.9 kg), last menstrual period 02/08/2020. Pregravid weight 135 lb (61.2 kg) Total Weight Gain 28 lb (12.7 kg) Urinalysis:      Fetal Status: Fetal Heart Rate (bpm): 155 Fundal Height: 27 cm Movement: Present     General:  Alert, oriented and cooperative. Patient is in no acute distress.  Skin: Skin is warm and dry. No rash noted.   Cardiovascular: Normal heart rate noted  Respiratory: Normal respiratory effort, no problems with respiration noted  Abdomen: Soft, gravid, appropriate for gestational age. Pain/Pressure: Absent     Pelvic:  Cervical exam deferred        Extremities: Normal range of motion.     ental Status: Normal mood and affect. Normal behavior. Normal judgment and thought content.     Assessment   35 y.o. [id number] at [redacted]w[redacted]d by  11/14/2020, by Last Menstrual Period presenting for routine prenatal visit  Plan    pregnancy 4 Problems (from 03/21/20 to present)    Problem Noted Resolved   History of maternal third degree perineal laceration, currently pregnant 03/21/2020 by Malachy Mood, MD No   Supervision of multigravida of advanced maternal age, antepartum 03/21/2020 by Malachy Mood, MD No   Overview Addendum 05/03/2020 11:46 AM by Malachy Mood, MD    [facility] Prenatal Labs  Dating LMP = 7 week [place] Blood type: O/Negative/-- (12/02 1618)   Genetic Screen -NIPS: Normal XY CF neg, Fragile-X neg, SMA neg Antibody:Negative (12/02 1618)  Anatomic [place]  Rubella: 8.37 (12/02 1618) Varicella: Immune  GTT Third trimester:  RPR: Non Reactive (12/02 1618)   Rhogam [ ]  28 weeks HBsAg: Negative (12/02 1618)   TDaP vaccine                       Flu Shot: HIV: Non Reactive (12/02 1618)   Baby Food                                GBS:   Contraception  Pap: 10/10/2019 NILM HVP negative  CBB     CS/VBAC [ ]  repeat C-section   Support Person Husband Dawn Gibbs           Previous Version       Gestational age appropriate obstetric precautions including but not limited to vaginal bleeding, contractions, leaking of fluid and fetal movement were reviewed in detail with the patient.    - 28 week labs and rhogam today - Recent COVID infection mild, discussed no additional follow up indicated if fundal height remain appropriate.    Return in about 2 weeks (around 09/03/2020) for  ROBMalachy Mood, MD, [facility], [place] 08/20/2020, 10:22 AM

## 2020-08-21 LAB — 28 WEEKS RH-PANEL
Antibody Screen: NEGATIVE
Basophils Absolute: 0 10*3/uL (ref 0.0–0.2)
Basos: 0 %
EOS (ABSOLUTE): 0.1 10*3/uL (ref 0.0–0.4)
Eos: 1 %
Gestational Diabetes Screen: 75 mg/dL (ref 65–139)
HIV Screen 4th Generation wRfx: NONREACTIVE
Hematocrit: 35.3 % (ref 34.0–46.6)
Hemoglobin: 12.2 g/dL (ref 11.1–15.9)
Immature Grans (Abs): 0 10*3/uL (ref 0.0–0.1)
Immature Granulocytes: 0 %
Lymphocytes Absolute: 1.2 10*3/uL (ref 0.7–3.1)
Lymphs: 18 %
MCH: 35.8 pg — ABNORMAL HIGH (ref 26.6–33.0)
MCHC: 34.6 g/dL (ref 31.5–35.7)
MCV: 104 fL — ABNORMAL HIGH (ref 79–97)
Monocytes Absolute: 0.5 10*3/uL (ref 0.1–0.9)
Monocytes: 7 %
Neutrophils Absolute: 5 10*3/uL (ref 1.4–7.0)
Neutrophils: 74 %
Platelets: 148 10*3/uL — ABNORMAL LOW (ref 150–450)
RBC: 3.41 x10E6/uL — ABNORMAL LOW (ref 3.77–5.28)
RDW: 12.4 % (ref 11.7–15.4)
RPR Ser Ql: NONREACTIVE
WBC: 6.8 10*3/uL (ref 3.4–10.8)

## 2020-09-06 ENCOUNTER — Ambulatory Visit (INDEPENDENT_AMBULATORY_CARE_PROVIDER_SITE_OTHER): Payer: 59 | Admitting: Obstetrics and Gynecology

## 2020-09-06 ENCOUNTER — Other Ambulatory Visit: Payer: Self-pay

## 2020-09-06 VITALS — BP 118/62 | Wt 164.0 lb

## 2020-09-06 DIAGNOSIS — Z862 Personal history of diseases of the blood and blood-forming organs and certain disorders involving the immune mechanism: Secondary | ICD-10-CM

## 2020-09-06 DIAGNOSIS — O09529 Supervision of elderly multigravida, unspecified trimester: Secondary | ICD-10-CM

## 2020-09-06 DIAGNOSIS — Z3A30 30 weeks gestation of pregnancy: Secondary | ICD-10-CM

## 2020-09-06 DIAGNOSIS — O99113 Other diseases of the blood and blood-forming organs and certain disorders involving the immune mechanism complicating pregnancy, third trimester: Secondary | ICD-10-CM

## 2020-09-06 DIAGNOSIS — Z98891 History of uterine scar from previous surgery: Secondary | ICD-10-CM

## 2020-09-06 DIAGNOSIS — O09299 Supervision of pregnancy with other poor reproductive or obstetric history, unspecified trimester: Secondary | ICD-10-CM

## 2020-09-06 DIAGNOSIS — D696 Thrombocytopenia, unspecified: Secondary | ICD-10-CM

## 2020-09-06 LAB — POCT URINALYSIS DIPSTICK OB
Glucose, UA: NEGATIVE
POC,PROTEIN,UA: NEGATIVE

## 2020-09-06 NOTE — Progress Notes (Signed)
Routine Prenatal Care Visit  Subjective  Dawn Gibbs is a 35 y.o. G4P2011 at [redacted]w[redacted]d being seen today for ongoing prenatal care.  She is currently monitored for the following issues for this low-risk pregnancy and has History of cesarean section; History of decreased platelet count; History of maternal third degree perineal laceration, currently pregnant; Supervision of multigravida of advanced maternal age, antepartum; History of retinal tear; and Benign gestational thrombocytopenia in third trimester [facility]) on their problem list.  ----------------------------------------------------------------------------------- Patient reports no complaints.   Contractions: Irregular. Vag. Bleeding: None.  Movement: Present. Denies leaking of fluid.  ----------------------------------------------------------------------------------- The following portions of the patient's history were reviewed and updated as appropriate: allergies, current medications, past family history, past medical history, past social history, past surgical history and problem list. Problem list updated.   Objective  Blood pressure 118/62, weight 164 lb (74.4 kg), last menstrual period 02/08/2020. Pregravid weight 135 lb (61.2 kg) Total Weight Gain 29 lb (13.2 kg) Urinalysis:      Fetal Status: Fetal Heart Rate (bpm): 145 Fundal Height: 30 cm Movement: Present  Presentation: Vertex  General:  Alert, oriented and cooperative. Patient is in no acute distress.  Skin: Skin is warm and dry. No rash noted.   Cardiovascular: Normal heart rate noted  Respiratory: Normal respiratory effort, no problems with respiration noted  Abdomen: Soft, gravid, appropriate for gestational age. Pain/Pressure: Absent     Pelvic:  Cervical exam deferred        Extremities: Normal range of motion.     ental Status: Normal mood and affect. Normal behavior. Normal judgment and thought content.   Immunization History  Administered Date(s)  Administered  . Influenza-Unspecified 01/20/2020    Assessment   35 y.o. [id number] at [redacted]w[redacted]d by  11/14/2020, by Last Menstrual Period presenting for routine prenatal visit  Plan   pregnancy 4 Problems (from 03/21/20 to present)    Problem Noted Resolved   History of maternal third degree perineal laceration, currently pregnant 03/21/2020 by Malachy Mood, MD No   Supervision of multigravida of advanced maternal age, antepartum 03/21/2020 by Malachy Mood, MD No   Overview Addendum 05/03/2020 11:46 AM by Malachy Mood, MD    [facility] Prenatal Labs  Dating LMP = 7 week [place] Blood type: O/Negative/-- (12/02 1618)   Genetic Screen -NIPS: Normal XY CF neg, Fragile-X neg, SMA neg Antibody:Negative (12/02 1618)  Anatomic [place] Normal other than bilateral choroid plexus cyst Rubella: 8.37 (12/02 1618) Varicella: Immune  GTT Third trimester: 75 RPR: Non Reactive (12/02 1618)   Rhogam 08/20/2020 HBsAg: Negative (12/02 1618)   TDaP vaccine                       Flu Shot: 01/20/2020 HIV: Non Reactive (12/02 1618)   Baby Food                                GBS:   Contraception  Pap: 10/10/2019 NILM HVP negative  CBB     CS/VBAC [ ]  repeat C-section   Support Person Husband [name]           Previous Version       Gestational age appropriate obstetric precautions including but not limited to vaginal bleeding, contractions, leaking of fluid and fetal movement were reviewed in detail with the patient.    Return in about 2 weeks (around 09/20/2020) for ROB.  Malachy Mood, MD,  [place] 09/06/2020, 10:26 AM

## 2020-09-24 ENCOUNTER — Other Ambulatory Visit: Payer: Self-pay

## 2020-09-24 ENCOUNTER — Ambulatory Visit (INDEPENDENT_AMBULATORY_CARE_PROVIDER_SITE_OTHER): Payer: 59 | Admitting: Obstetrics and Gynecology

## 2020-09-24 VITALS — BP 110/66 | Wt 168.0 lb

## 2020-09-24 DIAGNOSIS — O99113 Other diseases of the blood and blood-forming organs and certain disorders involving the immune mechanism complicating pregnancy, third trimester: Secondary | ICD-10-CM

## 2020-09-24 DIAGNOSIS — Z98891 History of uterine scar from previous surgery: Secondary | ICD-10-CM

## 2020-09-24 DIAGNOSIS — O09529 Supervision of elderly multigravida, unspecified trimester: Secondary | ICD-10-CM

## 2020-09-24 DIAGNOSIS — D696 Thrombocytopenia, unspecified: Secondary | ICD-10-CM

## 2020-09-24 DIAGNOSIS — Z3A32 32 weeks gestation of pregnancy: Secondary | ICD-10-CM

## 2020-09-24 DIAGNOSIS — O09299 Supervision of pregnancy with other poor reproductive or obstetric history, unspecified trimester: Secondary | ICD-10-CM

## 2020-09-24 LAB — POCT URINALYSIS DIPSTICK OB
Glucose, UA: NEGATIVE
POC,PROTEIN,UA: NEGATIVE

## 2020-09-24 NOTE — Progress Notes (Signed)
ROB - restless bilat legs at night. RM 4

## 2020-09-24 NOTE — Progress Notes (Signed)
    Routine Prenatal Care Visit  Subjective  Dawn Gibbs is a 35 y.o. G4P2011 at [redacted]w[redacted]d being seen today for ongoing prenatal care.  She is currently monitored for the following issues for this low-risk pregnancy and has History of cesarean section; History of decreased platelet count; History of maternal third degree perineal laceration, currently pregnant; Supervision of multigravida of advanced maternal age, antepartum; History of retinal tear; and Benign gestational thrombocytopenia in third trimester [facility]) on their problem list.  ----------------------------------------------------------------------------------- Patient reports no complaints.   Contractions: Not present. Vag. Bleeding: None.  Movement: Present. Denies leaking of fluid.  ----------------------------------------------------------------------------------- The following portions of the patient's history were reviewed and updated as appropriate: allergies, current medications, past family history, past medical history, past social history, past surgical history and problem list. Problem list updated.   Objective  Blood pressure 110/66, weight 168 lb (76.2 kg), last menstrual period 02/08/2020. Pregravid weight 135 lb (61.2 kg) Total Weight Gain 33 lb (15 kg) Urinalysis:      Fetal Status: Fetal Heart Rate (bpm): 155 Fundal Height: 32 cm Movement: Present  Presentation: Vertex  General:  Alert, oriented and cooperative. Patient is in no acute distress.  Skin: Skin is warm and dry. No rash noted.   Cardiovascular: Normal heart rate noted  Respiratory: Normal respiratory effort, no problems with respiration noted  Abdomen: Soft, gravid, appropriate for gestational age. Pain/Pressure: Absent     Pelvic:  Cervical exam deferred        Extremities: Normal range of motion.     ental Status: Normal mood and affect. Normal behavior. Normal judgment and thought content.     Assessment   35 y.o. [id number] at [redacted]w[redacted]d by   11/14/2020, by Last Menstrual Period presenting for routine prenatal visit  Plan   pregnancy 4 Problems (from 03/21/20 to present)    Problem Noted Resolved   Benign gestational thrombocytopenia in third trimester ([place]) 09/06/2020 by Malachy Mood, MD No   History of maternal third degree perineal laceration, currently pregnant 03/21/2020 by Malachy Mood, MD No   Supervision of multigravida of advanced maternal age, antepartum 03/21/2020 by Malachy Mood, MD No   Overview Addendum 09/06/2020 10:26 AM by Malachy Mood, MD    [facility] Prenatal Labs  Dating LMP = 7 week [place] Blood type: O/Negative/-- (12/02 1618)   Genetic Screen -NIPS: Normal XY CF neg, Fragile-X neg, SMA neg Antibody:Negative (12/02 1618)  Anatomic [place] Normal other than bilateral choroid plexus cyst Rubella: 8.37 (12/02 1618) Varicella: Immune  GTT Third trimester: 76 RPR: Non Reactive (12/02 1618)   Rhogam 08/20/2020 HBsAg: Negative (12/02 1618)   TDaP vaccine Declines (allergy) Flu Shot: 01/20/2020 HIV: Non Reactive (12/02 1618)   Baby Food                                GBS:   Contraception  Pap: 10/10/2019 NILM HVP negative  CBB     CS/VBAC [ ]  repeat C-section   Support Person Husband [name]           Previous Version       Gestational age appropriate obstetric precautions including but not limited to vaginal bleeding, contractions, leaking of fluid and fetal movement were reviewed in detail with the patient.    Return in about 2 weeks (around 10/08/2020) for ROB.  Malachy Mood, MD, [facility], [facility] 09/24/2020, 3:42 PM

## 2020-10-09 ENCOUNTER — Other Ambulatory Visit: Payer: Self-pay

## 2020-10-09 ENCOUNTER — Ambulatory Visit (INDEPENDENT_AMBULATORY_CARE_PROVIDER_SITE_OTHER): Payer: 59 | Admitting: Obstetrics and Gynecology

## 2020-10-09 VITALS — BP 106/66 | Wt 170.0 lb

## 2020-10-09 DIAGNOSIS — O09299 Supervision of pregnancy with other poor reproductive or obstetric history, unspecified trimester: Secondary | ICD-10-CM

## 2020-10-09 DIAGNOSIS — O99113 Other diseases of the blood and blood-forming organs and certain disorders involving the immune mechanism complicating pregnancy, third trimester: Secondary | ICD-10-CM

## 2020-10-09 DIAGNOSIS — Z862 Personal history of diseases of the blood and blood-forming organs and certain disorders involving the immune mechanism: Secondary | ICD-10-CM

## 2020-10-09 DIAGNOSIS — D696 Thrombocytopenia, unspecified: Secondary | ICD-10-CM

## 2020-10-09 DIAGNOSIS — Z98891 History of uterine scar from previous surgery: Secondary | ICD-10-CM

## 2020-10-09 DIAGNOSIS — O09529 Supervision of elderly multigravida, unspecified trimester: Secondary | ICD-10-CM

## 2020-10-09 LAB — POCT URINALYSIS DIPSTICK OB
Glucose, UA: NEGATIVE
POC,PROTEIN,UA: NEGATIVE

## 2020-10-09 NOTE — Progress Notes (Signed)
Routine Prenatal Care Visit  Subjective  Dawn Gibbs is a 35 y.o. G4P2011 at [redacted]w[redacted]d being seen today for ongoing prenatal care.  She is currently monitored for the following issues for this low-risk pregnancy and has History of cesarean section; History of decreased platelet count; History of maternal third degree perineal laceration, currently pregnant; Supervision of multigravida of advanced maternal age, antepartum; History of retinal tear; and Benign gestational thrombocytopenia in third trimester [facility]) on their problem list.  ----------------------------------------------------------------------------------- Patient reports no complaints.   Contractions: Not present. Vag. Bleeding: None.  Movement: Present. Denies leaking of fluid.  ----------------------------------------------------------------------------------- The following portions of the patient's history were reviewed and updated as appropriate: allergies, current medications, past family history, past medical history, past social history, past surgical history and problem list. Problem list updated.   Objective  Blood pressure 106/66, weight 170 lb (77.1 kg), last menstrual period 02/08/2020. Pregravid weight 135 lb (61.2 kg) Total Weight Gain 35 lb (15.9 kg) Urinalysis:      Fetal Status: Fetal Heart Rate (bpm): 140 Fundal Height: 34 cm Movement: Present  Presentation: Vertex  General:  Alert, oriented and cooperative. Patient is in no acute distress.  Skin: Skin is warm and dry. No rash noted.   Cardiovascular: Normal heart rate noted  Respiratory: Normal respiratory effort, no problems with respiration noted  Abdomen: Soft, gravid, appropriate for gestational age. Pain/Pressure: Absent     Pelvic:  Cervical exam deferred        Extremities: Normal range of motion.     ental Status: Normal mood and affect. Normal behavior. Normal judgment and thought content.     Assessment   35 y.o. [id number] at [redacted]w[redacted]d by   11/14/2020, by Last Menstrual Period presenting for routine prenatal visit  Plan   pregnancy 4 Problems (from 03/21/20 to present)     Problem Noted Resolved   Benign gestational thrombocytopenia in third trimester ([place]) 09/06/2020 by Malachy Mood, MD No   History of maternal third degree perineal laceration, currently pregnant 03/21/2020 by Malachy Mood, MD No   Supervision of multigravida of advanced maternal age, antepartum 03/21/2020 by Malachy Mood, MD No   Overview Addendum 09/06/2020 10:26 AM by Malachy Mood, MD    [facility] Prenatal Labs  Dating LMP = 7 week [place] Blood type: O/Negative/-- (12/02 1618)   Genetic Screen -NIPS: Normal XY CF neg, Fragile-X neg, SMA neg Antibody:Negative (12/02 1618)  Anatomic [place] Normal other than bilateral choroid plexus cyst Rubella: 8.37 (12/02 1618) Varicella: Immune  GTT Third trimester: 76 RPR: Non Reactive (12/02 1618)   Rhogam 08/20/2020 HBsAg: Negative (12/02 1618)   TDaP vaccine Declines (allergy) Flu Shot: 01/20/2020 HIV: Non Reactive (12/02 1618)   Baby Food                                GBS:   Contraception  Pap: 10/10/2019 NILM HVP negative  CBB     CS/VBAC [ ]  repeat C-section   Support Person Husband [name]                 Gestational age appropriate obstetric precautions including but not limited to vaginal bleeding, contractions, leaking of fluid and fetal movement were reviewed in detail with the patient.    Return in about 1 week (around 10/16/2020) for ROB, weekly next 4 weeks.  Malachy Mood, MD, [facility], [facility] 10/09/2020, 9:39 AM

## 2020-10-09 NOTE — Progress Notes (Signed)
ROB - no concerns. RM 2

## 2020-10-14 ENCOUNTER — Other Ambulatory Visit: Payer: Self-pay | Admitting: Obstetrics and Gynecology

## 2020-10-16 ENCOUNTER — Other Ambulatory Visit: Payer: Self-pay

## 2020-10-16 ENCOUNTER — Ambulatory Visit (INDEPENDENT_AMBULATORY_CARE_PROVIDER_SITE_OTHER): Payer: 59 | Admitting: Advanced Practice Midwife

## 2020-10-16 ENCOUNTER — Other Ambulatory Visit (HOSPITAL_COMMUNITY)
Admission: RE | Admit: 2020-10-16 | Discharge: 2020-10-16 | Disposition: A | Discharge status: Home / Self Care | Payer: 59 | Source: Ambulatory Visit | Attending: Advanced Practice Midwife | Admitting: Advanced Practice Midwife

## 2020-10-16 ENCOUNTER — Encounter: Payer: Self-pay | Admitting: Advanced Practice Midwife

## 2020-10-16 VITALS — BP 95/58 | Wt 171.0 lb

## 2020-10-16 DIAGNOSIS — Z113 Encounter for screening for infections with a predominantly sexual mode of transmission: Secondary | ICD-10-CM | POA: Insufficient documentation

## 2020-10-16 DIAGNOSIS — Z369 Encounter for antenatal screening, unspecified: Secondary | ICD-10-CM | POA: Diagnosis present

## 2020-10-16 DIAGNOSIS — O09523 Supervision of elderly multigravida, third trimester: Secondary | ICD-10-CM | POA: Diagnosis not present

## 2020-10-16 DIAGNOSIS — Z3A35 35 weeks gestation of pregnancy: Secondary | ICD-10-CM | POA: Diagnosis present

## 2020-10-16 DIAGNOSIS — Z3685 Encounter for antenatal screening for Streptococcus B: Secondary | ICD-10-CM

## 2020-10-16 LAB — POCT URINALYSIS DIPSTICK OB
Glucose, UA: NEGATIVE
POC,PROTEIN,UA: NEGATIVE

## 2020-10-16 NOTE — Addendum Note (Signed)
Addended by: Drenda Freeze on: 10/16/2020 10:26 AM   Modules accepted: Orders

## 2020-10-16 NOTE — Progress Notes (Signed)
  Routine Prenatal Care Visit  Subjective  Dawn Gibbs is a 35 y.o. G4P2011 at [redacted]w[redacted]d being seen today for ongoing prenatal care.  She is currently monitored for the following issues for this high-risk pregnancy and has History of cesarean section; History of decreased platelet count; History of maternal third degree perineal laceration, currently pregnant; Supervision of multigravida of advanced maternal age, antepartum; History of retinal tear; Benign gestational thrombocytopenia in third trimester [facility]); and History of melanoma on their problem list.  ----------------------------------------------------------------------------------- Patient reports no complaints.   Contractions: Not present. Vag. Bleeding: None.  Movement: Present. Leaking Fluid denies.  ----------------------------------------------------------------------------------- The following portions of the patient's history were reviewed and updated as appropriate: allergies, current medications, past family history, past medical history, past social history, past surgical history and problem list. Problem list updated.  Objective  Blood pressure (!) 95/58, weight 171 lb (77.6 kg), last menstrual period 02/08/2020. Pregravid weight 135 lb (61.2 kg) Total Weight Gain 36 lb (16.3 kg) Urinalysis: Urine Protein    Urine Glucose    Fetal Status: Fetal Heart Rate (bpm): 144 Fundal Height: 36 cm Movement: Present     General:  Alert, oriented and cooperative. Patient is in no acute distress.  Skin: Skin is warm and dry. No rash noted.   Cardiovascular: Normal heart rate noted  Respiratory: Normal respiratory effort, no problems with respiration noted  Abdomen: Soft, gravid, appropriate for gestational age. Pain/Pressure: Absent     Pelvic:  GBS/aptima collected        Extremities: Normal range of motion.  Edema: None  Mental Status: Normal mood and affect. Normal behavior. Normal judgment and thought content.   Assessment   35  y.o. [id number] at [redacted]w[redacted]d by  11/14/2020, by Last Menstrual Period presenting for routine prenatal visit  Plan   pregnancy 4 Problems (from 03/21/20 to present)    Problem Noted Resolved   Benign gestational thrombocytopenia in third trimester ([place]) 09/06/2020 by Malachy Mood, MD No   History of maternal third degree perineal laceration, currently pregnant 03/21/2020 by Malachy Mood, MD No   Supervision of multigravida of advanced maternal age, antepartum 03/21/2020 by Malachy Mood, MD No   Overview Addendum 09/06/2020 10:26 AM by Malachy Mood, MD    [facility] Prenatal Labs  Dating LMP = 7 week [place] Blood type: O/Negative/-- (12/02 1618)   Genetic Screen -NIPS: Normal XY CF neg, Fragile-X neg, SMA neg Antibody:Negative (12/02 1618)  Anatomic [place] Normal other than bilateral choroid plexus cyst Rubella: 8.37 (12/02 1618) Varicella: Immune  GTT Third trimester: 76 RPR: Non Reactive (12/02 1618)   Rhogam 08/20/2020 HBsAg: Negative (12/02 1618)   TDaP vaccine Declines (allergy) Flu Shot: 01/20/2020 HIV: Non Reactive (12/02 1618)   Baby Food                                GBS:   Contraception  Pap: 10/10/2019 NILM HVP negative  CBB     CS/VBAC [ ]  repeat C-section   Support Person Husband Dawn Gibbs                Preterm labor symptoms and general obstetric precautions including but not limited to vaginal bleeding, contractions, leaking of fluid and fetal movement were reviewed in detail with the patient.    Return for scheduled prenatal appointments.  Rod Can, CNM 10/16/2020 10:18 AM

## 2020-10-16 NOTE — Progress Notes (Signed)
ROB- no concerns 

## 2020-10-17 LAB — CERVICOVAGINAL ANCILLARY ONLY
Chlamydia: NEGATIVE
Comment: NEGATIVE
Comment: NEGATIVE
Comment: NORMAL
Neisseria Gonorrhea: NEGATIVE
Trichomonas: NEGATIVE

## 2020-10-18 LAB — STREP GP B NAA: Strep Gp B NAA: POSITIVE — AB

## 2020-10-22 ENCOUNTER — Other Ambulatory Visit: Payer: Self-pay | Admitting: Obstetrics and Gynecology

## 2020-10-22 DIAGNOSIS — O09529 Supervision of elderly multigravida, unspecified trimester: Secondary | ICD-10-CM

## 2020-10-23 ENCOUNTER — Other Ambulatory Visit: Payer: 59

## 2020-10-23 ENCOUNTER — Other Ambulatory Visit: Payer: Self-pay

## 2020-10-23 DIAGNOSIS — O09529 Supervision of elderly multigravida, unspecified trimester: Secondary | ICD-10-CM

## 2020-10-23 NOTE — Telephone Encounter (Signed)
Called and scheduled patient for 10:40 for today 10/23/20 for labs

## 2020-10-24 ENCOUNTER — Other Ambulatory Visit
Admission: RE | Admit: 2020-10-24 | Discharge: 2020-10-24 | Disposition: A | Discharge status: Home / Self Care | Payer: 59 | Source: Ambulatory Visit | Attending: Obstetrics and Gynecology | Admitting: Obstetrics and Gynecology

## 2020-10-24 LAB — CBC
Hematocrit: 35.7 % (ref 34.0–46.6)
Hemoglobin: 12.4 g/dL (ref 11.1–15.9)
MCH: 35 pg — ABNORMAL HIGH (ref 26.6–33.0)
MCHC: 34.7 g/dL (ref 31.5–35.7)
MCV: 101 fL — ABNORMAL HIGH (ref 79–97)
Platelets: 118 10*3/uL — ABNORMAL LOW (ref 150–450)
RBC: 3.54 x10E6/uL — ABNORMAL LOW (ref 3.77–5.28)
RDW: 12.6 % (ref 11.7–15.4)
WBC: 7.2 10*3/uL (ref 3.4–10.8)

## 2020-10-24 NOTE — Patient Instructions (Addendum)
Your procedure is scheduled on: 11/07/20 Report to [place] at 8:00 am. [facility] [phone] Pre Admit Testing [phone]  Remember: Instructions that are not followed completely may result in serious medical risk, up to and including death, or upon the discretion of your surgeon and anesthesiologist your surgery may need to be rescheduled.     _X__ 1. Do not eat food or drink liquids after midnight the night before your procedure.                 No gum chewing or hard candies.   __X__2.  On the morning of surgery brush your teeth with toothpaste and water, you                 may rinse your mouth with mouthwash if you wish.  Do not swallow any              toothpaste of mouthwash.     _X__ 3.  No Alcohol for 24 hours before or after surgery.   _X__ 4.  Do Not Smoke or use e-cigarettes For 24 Hours Prior to Your Surgery.                 Do not use any chewable tobacco products for at least 6 hours prior to                 surgery.  ____  5.  Bring all medications with you on the day of surgery if instructed.   __X__  6.  Notify your doctor if there is any change in your medical condition      (cold, fever, infections).     Do not wear jewelry, make-up, hairpins, clips or nail polish. Do not wear lotions, powders, or perfumes.  Do not shave 48 hours prior to surgery. Men may shave face and neck. Do not bring valuables to the hospital.    [facility] is not responsible for any belongings or valuables.  Contacts, dentures/partials or body piercings may not be worn into surgery. Bring a case for your contacts, glasses or hearing aids, a denture cup will be supplied. Leave your suitcase in the car. After surgery it may be brought to your room. For patients admitted to the hospital, discharge time is determined by your treatment team.   Patients discharged the day of surgery will not be allowed to drive home.   Please read over the following fact sheets that you  were given:   MRSA Information, CHG  __X__ Take these medicines the morning of surgery with A SIP OF WATER:    1. none  2.   3.   4.  5.  6.  ____ Fleet Enema (as directed)   __X__ Use CHG Soap/SAGE wipes as directed  ____ Use inhalers on the day of surgery  ____ Stop metformin/Janumet/Farxiga 2 days prior to surgery    ____ Take 1/2 of usual insulin dose the night before surgery. No insulin the morning          of surgery.   ____ Stop Blood Thinners Coumadin/Plavix/Xarelto/Pleta/Pradaxa/Eliquis/Effient/Aspirin  on   Or contact your Surgeon, Cardiologist or Medical Doctor regarding  ability to stop your blood thinners  __X__ Stop Anti-inflammatories 7 days before surgery such as Advil, Ibuprofen, Motrin,  BC or Goodies Powder, Naprosyn, Naproxen, Aleve, Aspirin    __X__ Stop all herbal supplements, fish oil or vitamin E until after surgery.    ____ Bring C-Pap to the hospital.

## 2020-10-25 ENCOUNTER — Ambulatory Visit (INDEPENDENT_AMBULATORY_CARE_PROVIDER_SITE_OTHER): Payer: 59 | Admitting: Obstetrics and Gynecology

## 2020-10-25 ENCOUNTER — Other Ambulatory Visit: Payer: Self-pay

## 2020-10-25 ENCOUNTER — Encounter: Payer: Self-pay | Admitting: Obstetrics and Gynecology

## 2020-10-25 VITALS — BP 102/66 | Wt 172.0 lb

## 2020-10-25 DIAGNOSIS — O09523 Supervision of elderly multigravida, third trimester: Secondary | ICD-10-CM

## 2020-10-25 DIAGNOSIS — Z98891 History of uterine scar from previous surgery: Secondary | ICD-10-CM

## 2020-10-25 DIAGNOSIS — Z3A37 37 weeks gestation of pregnancy: Secondary | ICD-10-CM

## 2020-10-25 DIAGNOSIS — O09299 Supervision of pregnancy with other poor reproductive or obstetric history, unspecified trimester: Secondary | ICD-10-CM

## 2020-10-25 DIAGNOSIS — D696 Thrombocytopenia, unspecified: Secondary | ICD-10-CM

## 2020-10-25 DIAGNOSIS — O99113 Other diseases of the blood and blood-forming organs and certain disorders involving the immune mechanism complicating pregnancy, third trimester: Secondary | ICD-10-CM

## 2020-10-25 LAB — POCT URINALYSIS DIPSTICK OB
Glucose, UA: NEGATIVE
POC,PROTEIN,UA: NEGATIVE

## 2020-10-25 NOTE — Progress Notes (Signed)
Routine Prenatal Care Visit  Subjective  Dawn Gibbs is a 35 y.o. G4P2011 at [redacted]w[redacted]d being seen today for ongoing prenatal care.  She is currently monitored for the following issues for this low-risk pregnancy and has History of cesarean section; History of decreased platelet count; History of maternal third degree perineal laceration, currently pregnant; Supervision of multigravida of advanced maternal age, antepartum; History of retinal tear; Benign gestational thrombocytopenia in third trimester [facility]); and History of melanoma on their problem list.  ----------------------------------------------------------------------------------- Patient reports no complaints.   Contractions: Irritability. Vag. Bleeding: None.  Movement: Present. Leaking Fluid denies.  ----------------------------------------------------------------------------------- The following portions of the patient's history were reviewed and updated as appropriate: allergies, current medications, past family history, past medical history, past social history, past surgical history and problem list. Problem list updated.  Objective  Blood pressure 102/66, weight 172 lb (78 kg), last menstrual period 02/08/2020. Pregravid weight 135 lb (61.2 kg) Total Weight Gain 37 lb (16.8 kg) Urinalysis: Urine Protein    Urine Glucose    Fetal Status: Fetal Heart Rate (bpm): 135 Fundal Height: 37 cm Movement: Present  Presentation: Vertex  General:  Alert, oriented and cooperative. Patient is in no acute distress.  Skin: Skin is warm and dry. No rash noted.   Cardiovascular: Normal heart rate noted  Respiratory: Normal respiratory effort, no problems with respiration noted  Abdomen: Soft, gravid, appropriate for gestational age. Pain/Pressure: Absent     Pelvic:  Cervical exam performed Dilation: Closed Effacement (%): 40 Station: -3  Extremities: Normal range of motion.  Edema: None  Mental Status: Normal mood and affect. Normal behavior.  Normal judgment and thought content.   Female chaperone present for pelvic exam:   Assessment   35 y.o. [id number] at [redacted]w[redacted]d by  11/14/2020, by Last Menstrual Period presenting for routine prenatal visit  Plan   pregnancy 4 Problems (from 03/21/20 to present)     Problem Noted Resolved   Benign gestational thrombocytopenia in third trimester ([place]) 09/06/2020 by Malachy Mood, MD No   History of maternal third degree perineal laceration, currently pregnant 03/21/2020 by Malachy Mood, MD No   Supervision of multigravida of advanced maternal age, antepartum 03/21/2020 by Malachy Mood, MD No   Overview Addendum 09/06/2020 10:26 AM by Malachy Mood, MD    [facility] Prenatal Labs  Dating LMP = 7 week [place] Blood type: O/Negative/-- (12/02 1618)   Genetic Screen -NIPS: Normal XY CF neg, Fragile-X neg, SMA neg Antibody:Negative (12/02 1618)  Anatomic [place] Normal other than bilateral choroid plexus cyst Rubella: 8.37 (12/02 1618) Varicella: Immune  GTT Third trimester: 76 RPR: Non Reactive (12/02 1618)   Rhogam 08/20/2020 HBsAg: Negative (12/02 1618)   TDaP vaccine Declines (allergy) Flu Shot: 01/20/2020 HIV: Non Reactive (12/02 1618)   Baby Food                                GBS:   Contraception  Pap: 10/10/2019 NILM HVP negative  CBB     CS/VBAC [ ]  repeat C-section   Support Person Husband [name]                 Term labor symptoms and general obstetric precautions including but not limited to vaginal bleeding, contractions, leaking of fluid and fetal movement were reviewed in detail with the patient. Please refer to After Visit Summary for other counseling recommendations.   - discussed platelets briefly.  Platelet level 118  two days ago.   Return in about 1 week (around 11/01/2020) for Keep previously scheduled appointment with Dr. Georgianne Fick.   Prentice Docker, MD, [facility], [facility] 10/25/2020 10:45 AM

## 2020-10-30 ENCOUNTER — Other Ambulatory Visit: Payer: Self-pay

## 2020-10-30 ENCOUNTER — Ambulatory Visit (INDEPENDENT_AMBULATORY_CARE_PROVIDER_SITE_OTHER): Payer: 59 | Admitting: Obstetrics and Gynecology

## 2020-10-30 ENCOUNTER — Encounter: Payer: Self-pay | Admitting: Obstetrics and Gynecology

## 2020-10-30 VITALS — BP 108/70 | Ht 64.0 in | Wt 174.0 lb

## 2020-10-30 DIAGNOSIS — O09299 Supervision of pregnancy with other poor reproductive or obstetric history, unspecified trimester: Secondary | ICD-10-CM

## 2020-10-30 DIAGNOSIS — O09529 Supervision of elderly multigravida, unspecified trimester: Secondary | ICD-10-CM

## 2020-10-30 DIAGNOSIS — Z308 Encounter for other contraceptive management: Secondary | ICD-10-CM

## 2020-10-30 DIAGNOSIS — D696 Thrombocytopenia, unspecified: Secondary | ICD-10-CM

## 2020-10-30 DIAGNOSIS — Z98891 History of uterine scar from previous surgery: Secondary | ICD-10-CM

## 2020-10-30 DIAGNOSIS — O99113 Other diseases of the blood and blood-forming organs and certain disorders involving the immune mechanism complicating pregnancy, third trimester: Secondary | ICD-10-CM

## 2020-10-30 NOTE — Progress Notes (Signed)
Obstetric H&P   Chief Complaint: ROB and C-section scheduling  Prenatal Care Provider: [facility]  History of Present Illness: 35 y.o. [id number] [redacted]w[redacted]d by 11/14/2020, by Last Menstrual Period presenting for [place] visit and C-section consent.  +FM, no LOF, no VB, irregular contractions.  Pregnancy uncomplicated other than mild thrombocytopenia at 28 weeks with history of gestational thrombocytopenia in prior pregnancies.  She has a third degree laceration with G1 with cesarean section G2 desiring repeat cesarean with this pregnancy.  Pregravid weight 135 lb (61.2 kg) Total Weight Gain 37 lb (16.8 kg)  pregnancy 4 Problems (from 03/21/20 to present)     Problem Noted Resolved   Benign gestational thrombocytopenia in third trimester ([place]) 09/06/2020 by Malachy Mood, MD No   History of maternal third degree perineal laceration, currently pregnant 03/21/2020 by Malachy Mood, MD No   Supervision of multigravida of advanced maternal age, antepartum 03/21/2020 by Malachy Mood, MD No   Overview Addendum 09/06/2020 10:26 AM by Malachy Mood, MD    [facility]inic [facility] Prenatal Labs  Dating LMP = 7 week [place] Blood type: O/Negative/-- (12/02 1618)   Genetic Screen -NIPS: Normal XY CF neg, Fragile-X neg, SMA neg Antibody:Negative (12/02 1618)  Anatomic [place] Normal other than bilateral choroid plexus cyst Rubella: 8.37 (12/02 1618) Varicella: Immune  GTT Third trimester: 76 RPR: Non Reactive (12/02 1618)   Rhogam 08/20/2020 HBsAg: Negative (12/02 1618)   TDaP vaccine Declines (allergy) Flu Shot: 01/20/2020 HIV: Non Reactive (12/02 1618)   Baby Food                                GBS:   Contraception  Pap: 10/10/2019 NILM HVP negative  CBB     CS/VBAC [ ]  repeat C-section   Support Person Husband [name]                 Review of Systems: 10 point review of systems negative unless otherwise noted in HPI  Past Medical History: Patient Active Problem List   Diagnosis Date Noted   Benign  gestational thrombocytopenia in third trimester ([place]) 09/06/2020   History of retinal tear 07/19/2020   History of decreased platelet count 03/21/2020   History of maternal third degree perineal laceration, currently pregnant 03/21/2020   Supervision of multigravida of advanced maternal age, antepartum 03/21/2020    [facility] Prenatal Labs  Dating LMP = 7 week [place] Blood type: O/Negative/-- (12/02 1618)   Genetic Screen -NIPS: Normal XY CF neg, Fragile-X neg, SMA neg Antibody:Negative (12/02 1618)  Anatomic [place] Normal other than bilateral choroid plexus cyst Rubella: 8.37 (12/02 1618) Varicella: Immune  GTT Third trimester: 76 RPR: Non Reactive (12/02 1618)   Rhogam 08/20/2020 HBsAg: Negative (12/02 1618)   TDaP vaccine Declines (allergy) Flu Shot: 01/20/2020 HIV: Non Reactive (12/02 1618)   Baby Food                                GBS:   Contraception  Pap: 10/10/2019 NILM HVP negative  CBB     CS/VBAC [ ]  repeat C-section   Support Person Husband [name]         History of melanoma 02/18/2017   History of cesarean section 05/03/2015    Past Surgical History: Past Surgical History:  Procedure Laterality Date   CESAREAN SECTION N/A 05/03/2015   Procedure: CESAREAN SECTION;  Surgeon: Malachy Mood,  MD;  Location: [facility] ORS;  Service: Obstetrics;  Laterality: N/A;   RETINAL TEAR REPAIR CRYOTHERAPY Right 2008   TONSILLECTOMY      Past Obstetric History: # 1 - Date: None, Sex: None, Weight: None, GA: None, Delivery: None, Apgar1: None, Apgar5: None, Living: None, Birth Comments: None  # 2 - Date: None, Sex: None, Weight: None, GA: None, Delivery: None, Apgar1: None, Apgar5: None, Living: None, Birth Comments: None  # 3 - Date: 05/03/15, Sex: Female, Weight: 6 lb 14.4 oz (3.13 kg), GA: [redacted]w[redacted]d, Delivery: C-Section, Low Transverse, Apgar1: 9, Apgar5: 9, Living: Living, Birth Comments: none  # 4 - Date: None, Sex: None, Weight: None, GA: None, Delivery: None, Apgar1: None, Apgar5:  None, Living: None, Birth Comments: None   Past Gynecologic History:  Family History: Family History  Problem Relation Age of Onset   Healthy Mother    Healthy Father    Breast cancer Maternal Grandmother     Social History: Social History   Socioeconomic History   Marital status: Married    Spouse name: Not on file   Number of children: Not on file   Years of education: Not on file   Highest education level: Not on file  Occupational History   Not on file  Tobacco Use   Smoking status: Never   Smokeless tobacco: Never  Vaping Use   Vaping Use: Never used  Substance and Sexual Activity   Alcohol use: No   Drug use: No   Sexual activity: Yes  Other Topics Concern   Not on file  Social History Narrative   Not on file   Social Determinants of Health   Financial Resource Strain: Not on file  Food Insecurity: Not on file  Transportation Needs: Not on file  Physical Activity: Not on file  Stress: Not on file  Social Connections: Not on file  Intimate Partner Violence: Not on file    Medications: Prior to Admission medications   Medication Sig Start Date End Date Taking? Authorizing Provider  escitalopram (LEXAPRO) 10 MG tablet TAKE 1 TABLET(10 MG) BY MOUTH DAILY Patient taking differently: Take 10 mg by mouth daily. 10/15/20  Yes Malachy Mood, MD  Magnesium Gluconate 250 MG TABS Take 250 mg by mouth daily.   Yes [provider]  Prenatal Vit-Fe Fumarate-FA (MULTIVITAMIN-PRENATAL) 27-0.8 MG TABS tablet Take 1 tablet by mouth daily at 12 noon.   Yes [provider]  fluticasone (FLONASE) 50 MCG/ACT nasal spray Place 2 sprays into both nostrils daily. 10/24/19 10/24/19  Melynda Ripple, MD    Allergies: Allergies  Allergen Reactions   Nickel Rash   Macrobid [Nitrofurantoin Monohyd Macro] Hives   Latex Rash    adhesive    Physical Exam: Vitals: Blood pressure 108/70, height 5\' 4"  (1.626 m), weight 174 lb (78.9 kg), last menstrual period  02/08/2020.  Urine Dip Protein: N/A  FHT: 140  General: NAD HEENT: normocephalic, anicteric Pulmonary: No increased work of breathing Cardiovascular: RRR, distal pulses 2+ Abdomen: Gravid, non-tender Leopolds: vtx Genitourinary: deferred Extremities: no edema, erythema, or tenderness Neurologic: Grossly intact Psychiatric: mood appropriate, affect full  Labs: No results found for this or any previous visit (from the past 24 hour(s)).  Assessment: 35 y.o. [id number] [redacted]w[redacted]d by 11/14/2020, by Last Menstrual Period presenting for C-section & BTL scheduling  Plan: 1) The patient was counseled regarding risk and benefits to proceeding with Cesarean section to expedite delivery.  Risk of cesarean section were discussed including risk of bleeding and need  for potential intraoperative or postoperative blood transfusion with a rate of approximately 5% quoted for all Cesarean sections, risk of injury to adjacent organs including but not limited to bowl and bladder, the need for additional surgical procedures to address such injuries, and the risk of infection.    35 y.o. [id number]  with undesired fertility, desires permanent sterilization.  Other reversible forms of contraception were discussed with patient; she declines all other modalities. Permanent nature of as well as associated risks of the procedure discussed with patient including but not limited to: risk of regret, permanence of method, bleeding, infection, injury to surrounding organs and need for additional procedures.  Failure risk of 0.5-1% with increased risk of ectopic gestation if pregnancy occurs was also discussed with patient.   - orders placed 7/13  2) Fetus - +FHT  3) PNL - Blood type O/Negative/-- (12/02 1618) / Anti-bodyscreen Negative (05/03 1038) / Rubella 8.37 (12/02 1618) / Varicella Immune / RPR Non Reactive (05/03 1038) / HBsAg Negative (12/02 1618) / HIV Non Reactive (05/03 1038) / 1-hr OGTT 76 / GBS Positive/-- (06/29  1007)  4) Immunization History -  Immunization History  Administered Date(s) Administered   Influenza-Unspecified 01/20/2020    5) Disposition - pending delivery  Malachy Mood, MD, [place] 10/30/2020, 10:53 AM

## 2020-10-30 NOTE — H&P (View-Only) (Signed)
Obstetric H&P   Chief Complaint: ROB and C-section scheduling  Prenatal Care Provider: [facility]  History of Present Illness: 35 y.o. [id number] [redacted]w[redacted]d by 11/14/2020, by Last Menstrual Period presenting for [place] visit and C-section consent.  +FM, no LOF, no VB, irregular contractions.  Pregnancy uncomplicated other than mild thrombocytopenia at 28 weeks with history of gestational thrombocytopenia in prior pregnancies.  She has a third degree laceration with G1 with cesarean section G2 desiring repeat cesarean with this pregnancy.  Pregravid weight 135 lb (61.2 kg) Total Weight Gain 37 lb (16.8 kg)  pregnancy 4 Problems (from 03/21/20 to present)     Problem Noted Resolved   Benign gestational thrombocytopenia in third trimester ([place]) 09/06/2020 by Malachy Mood, MD No   History of maternal third degree perineal laceration, currently pregnant 03/21/2020 by Malachy Mood, MD No   Supervision of multigravida of advanced maternal age, antepartum 03/21/2020 by Malachy Mood, MD No   Overview Addendum 09/06/2020 10:26 AM by Malachy Mood, MD    [facility] Prenatal Labs  Dating LMP = 7 week [place] Blood type: O/Negative/-- (12/02 1618)   Genetic Screen -NIPS: Normal XY CF neg, Fragile-X neg, SMA neg Antibody:Negative (12/02 1618)  Anatomic [place] Normal other than bilateral choroid plexus cyst Rubella: 8.37 (12/02 1618) Varicella: Immune  GTT Third trimester: 76 RPR: Non Reactive (12/02 1618)   Rhogam 08/20/2020 HBsAg: Negative (12/02 1618)   TDaP vaccine Declines (allergy) Flu Shot: 01/20/2020 HIV: Non Reactive (12/02 1618)   Baby Food                                GBS:   Contraception  Pap: 10/10/2019 NILM HVP negative  CBB     CS/VBAC [ ]  repeat C-section   Support Person Husband [name]                 Review of Systems: 10 point review of systems negative unless otherwise noted in HPI  Past Medical History: Patient Active Problem List   Diagnosis Date Noted   Benign  gestational thrombocytopenia in third trimester ([place]) 09/06/2020   History of retinal tear 07/19/2020   History of decreased platelet count 03/21/2020   History of maternal third degree perineal laceration, currently pregnant 03/21/2020   Supervision of multigravida of advanced maternal age, antepartum 03/21/2020    [facility] Prenatal Labs  Dating LMP = 7 week [place] Blood type: O/Negative/-- (12/02 1618)   Genetic Screen -NIPS: Normal XY CF neg, Fragile-X neg, SMA neg Antibody:Negative (12/02 1618)  Anatomic [place] Normal other than bilateral choroid plexus cyst Rubella: 8.37 (12/02 1618) Varicella: Immune  GTT Third trimester: 76 RPR: Non Reactive (12/02 1618)   Rhogam 08/20/2020 HBsAg: Negative (12/02 1618)   TDaP vaccine Declines (allergy) Flu Shot: 01/20/2020 HIV: Non Reactive (12/02 1618)   Baby Food                                GBS:   Contraception  Pap: 10/10/2019 NILM HVP negative  CBB     CS/VBAC [ ]  repeat C-section   Support Person Husband [name]         History of melanoma 02/18/2017   History of cesarean section 05/03/2015    Past Surgical History: Past Surgical History:  Procedure Laterality Date   CESAREAN SECTION N/A 05/03/2015   Procedure: CESAREAN SECTION;  Surgeon: Malachy Mood,  MD;  Location: [facility] ORS;  Service: Obstetrics;  Laterality: N/A;   RETINAL TEAR REPAIR CRYOTHERAPY Right 2008   TONSILLECTOMY      Past Obstetric History: # 1 - Date: None, Sex: None, Weight: None, GA: None, Delivery: None, Apgar1: None, Apgar5: None, Living: None, Birth Comments: None  # 2 - Date: None, Sex: None, Weight: None, GA: None, Delivery: None, Apgar1: None, Apgar5: None, Living: None, Birth Comments: None  # 3 - Date: 05/03/15, Sex: Female, Weight: 6 lb 14.4 oz (3.13 kg), GA: 106w2d, Delivery: C-Section, Low Transverse, Apgar1: 9, Apgar5: 9, Living: Living, Birth Comments: none  # 4 - Date: None, Sex: None, Weight: None, GA: None, Delivery: None, Apgar1: None, Apgar5:  None, Living: None, Birth Comments: None   Past Gynecologic History:  Family History: Family History  Problem Relation Age of Onset   Healthy Mother    Healthy Father    Breast cancer Maternal Grandmother     Social History: Social History   Socioeconomic History   Marital status: Married    Spouse name: Not on file   Number of children: Not on file   Years of education: Not on file   Highest education level: Not on file  Occupational History   Not on file  Tobacco Use   Smoking status: Never   Smokeless tobacco: Never  Vaping Use   Vaping Use: Never used  Substance and Sexual Activity   Alcohol use: No   Drug use: No   Sexual activity: Yes  Other Topics Concern   Not on file  Social History Narrative   Not on file   Social Determinants of Health   Financial Resource Strain: Not on file  Food Insecurity: Not on file  Transportation Needs: Not on file  Physical Activity: Not on file  Stress: Not on file  Social Connections: Not on file  Intimate Partner Violence: Not on file    Medications: Prior to Admission medications   Medication Sig Start Date End Date Taking? Authorizing Provider  escitalopram (LEXAPRO) 10 MG tablet TAKE 1 TABLET(10 MG) BY MOUTH DAILY Patient taking differently: Take 10 mg by mouth daily. 10/15/20  Yes Malachy Mood, MD  Magnesium Gluconate 250 MG TABS Take 250 mg by mouth daily.   Yes [provider]  Prenatal Vit-Fe Fumarate-FA (MULTIVITAMIN-PRENATAL) 27-0.8 MG TABS tablet Take 1 tablet by mouth daily at 12 noon.   Yes [provider]  fluticasone (FLONASE) 50 MCG/ACT nasal spray Place 2 sprays into both nostrils daily. 10/24/19 10/24/19  Melynda Ripple, MD    Allergies: Allergies  Allergen Reactions   Nickel Rash   Macrobid [Nitrofurantoin Monohyd Macro] Hives   Latex Rash    adhesive    Physical Exam: Vitals: Blood pressure 108/70, height 5\' 4"  (1.626 m), weight 174 lb (78.9 kg), last menstrual period  02/08/2020.  Urine Dip Protein: N/A  FHT: 140  General: NAD HEENT: normocephalic, anicteric Pulmonary: No increased work of breathing Cardiovascular: RRR, distal pulses 2+ Abdomen: Gravid, non-tender Leopolds: vtx Genitourinary: deferred Extremities: no edema, erythema, or tenderness Neurologic: Grossly intact Psychiatric: mood appropriate, affect full  Labs: No results found for this or any previous visit (from the past 24 hour(s)).  Assessment: 35 y.o. [id number] [redacted]w[redacted]d by 11/14/2020, by Last Menstrual Period presenting for C-section & BTL scheduling  Plan: 1) The patient was counseled regarding risk and benefits to proceeding with Cesarean section to expedite delivery.  Risk of cesarean section were discussed including risk of bleeding and need  for potential intraoperative or postoperative blood transfusion with a rate of approximately 5% quoted for all Cesarean sections, risk of injury to adjacent organs including but not limited to bowl and bladder, the need for additional surgical procedures to address such injuries, and the risk of infection.    35 y.o. [id number]  with undesired fertility, desires permanent sterilization.  Other reversible forms of contraception were discussed with patient; she declines all other modalities. Permanent nature of as well as associated risks of the procedure discussed with patient including but not limited to: risk of regret, permanence of method, bleeding, infection, injury to surrounding organs and need for additional procedures.  Failure risk of 0.5-1% with increased risk of ectopic gestation if pregnancy occurs was also discussed with patient.   - orders placed 7/13  2) Fetus - +FHT  3) PNL - Blood type O/Negative/-- (12/02 1618) / Anti-bodyscreen Negative (05/03 1038) / Rubella 8.37 (12/02 1618) / Varicella Immune / RPR Non Reactive (05/03 1038) / HBsAg Negative (12/02 1618) / HIV Non Reactive (05/03 1038) / 1-hr OGTT 76 / GBS Positive/-- (06/29  1007)  4) Immunization History -  Immunization History  Administered Date(s) Administered   Influenza-Unspecified 01/20/2020    5) Disposition - pending delivery  Malachy Mood, MD, [place] 10/30/2020, 10:53 AM

## 2020-11-05 ENCOUNTER — Encounter
Admission: RE | Admit: 2020-11-05 | Discharge: 2020-11-05 | Disposition: A | Discharge status: Home / Self Care | Payer: 59 | Source: Ambulatory Visit | Attending: Obstetrics and Gynecology | Admitting: Obstetrics and Gynecology

## 2020-11-05 ENCOUNTER — Other Ambulatory Visit: Payer: Self-pay

## 2020-11-05 ENCOUNTER — Other Ambulatory Visit: Payer: 59

## 2020-11-05 DIAGNOSIS — Z20822 Contact with and (suspected) exposure to covid-19: Secondary | ICD-10-CM | POA: Insufficient documentation

## 2020-11-05 DIAGNOSIS — Z01812 Encounter for preprocedural laboratory examination: Secondary | ICD-10-CM | POA: Insufficient documentation

## 2020-11-05 LAB — CBC WITH DIFFERENTIAL/PLATELET
Abs Immature Granulocytes: 0.04 10*3/uL (ref 0.00–0.07)
Basophils Absolute: 0 10*3/uL (ref 0.0–0.1)
Basophils Relative: 0 %
Eosinophils Absolute: 0.1 10*3/uL (ref 0.0–0.5)
Eosinophils Relative: 1 %
HCT: 34.9 % — ABNORMAL LOW (ref 36.0–46.0)
Hemoglobin: 12.2 g/dL (ref 12.0–15.0)
Immature Granulocytes: 1 %
Lymphocytes Relative: 21 %
Lymphs Abs: 1.6 10*3/uL (ref 0.7–4.0)
MCH: 35.7 pg — ABNORMAL HIGH (ref 26.0–34.0)
MCHC: 35 g/dL (ref 30.0–36.0)
MCV: 102 fL — ABNORMAL HIGH (ref 80.0–100.0)
Monocytes Absolute: 0.6 10*3/uL (ref 0.1–1.0)
Monocytes Relative: 8 %
Neutro Abs: 5.2 10*3/uL (ref 1.7–7.7)
Neutrophils Relative %: 69 %
Platelets: 107 10*3/uL — ABNORMAL LOW (ref 150–400)
RBC: 3.42 MIL/uL — ABNORMAL LOW (ref 3.87–5.11)
RDW: 12.9 % (ref 11.5–15.5)
WBC: 7.6 10*3/uL (ref 4.0–10.5)
nRBC: 0 % (ref 0.0–0.2)

## 2020-11-05 LAB — COMPREHENSIVE METABOLIC PANEL
ALT: 13 U/L (ref 0–44)
AST: 22 U/L (ref 15–41)
Albumin: 3 g/dL — ABNORMAL LOW (ref 3.5–5.0)
Alkaline Phosphatase: 125 U/L (ref 38–126)
Anion gap: 9 (ref 5–15)
BUN: 14 mg/dL (ref 6–20)
CO2: 24 mmol/L (ref 22–32)
Calcium: 8.9 mg/dL (ref 8.9–10.3)
Chloride: 101 mmol/L (ref 98–111)
Creatinine, Ser: 0.75 mg/dL (ref 0.44–1.00)
GFR, Estimated: >60 mL/min (ref >60)
Glucose, Bld: 110 mg/dL — ABNORMAL HIGH (ref 70–99)
Potassium: 3.2 mmol/L — ABNORMAL LOW (ref 3.5–5.1)
Sodium: 134 mmol/L — ABNORMAL LOW (ref 135–145)
Total Bilirubin: 0.7 mg/dL (ref 0.3–1.2)
Total Protein: 6.1 g/dL — ABNORMAL LOW (ref 6.5–8.1)

## 2020-11-05 LAB — SARS CORONAVIRUS 2 (TAT 6-24 HRS): SARS Coronavirus 2: NEGATIVE

## 2020-11-06 LAB — RPR: RPR Ser Ql: NONREACTIVE

## 2020-11-06 MED ORDER — CHLORHEXIDINE GLUCONATE 0.12 % MT SOLN
15.0000 mL | Freq: Once | OROMUCOSAL | Status: AC
  Administered 2020-11-07: 15 mL via OROMUCOSAL
  Filled 2020-11-06: qty 15

## 2020-11-06 MED ORDER — SOD CITRATE-CITRIC ACID 500-334 MG/5ML PO SOLN: 30.0000 mL | ORAL | Status: AC

## 2020-11-06 MED ORDER — BUPIVACAINE HCL (PF) 0.5 % IJ SOLN: 20.0000 mL | INTRAMUSCULAR | Status: DC

## 2020-11-06 MED ORDER — BUPIVACAINE 0.25 % ON-Q PUMP DUAL CATH 400 ML
400.0000 mL | INJECTION | Status: DC
  Filled 2020-11-06: qty 400

## 2020-11-06 MED ORDER — CEFAZOLIN SODIUM-DEXTROSE 2-4 GM/100ML-% IV SOLN
2.0000 g | INTRAVENOUS | Status: AC
  Administered 2020-11-07: 2 g via INTRAVENOUS
  Filled 2020-11-06: qty 100

## 2020-11-06 MED ORDER — LACTATED RINGERS IV SOLN: INTRAVENOUS | Status: DC

## 2020-11-06 MED ORDER — LACTATED RINGERS IV SOLN: Freq: Once | INTRAVENOUS | Status: AC

## 2020-11-06 MED ORDER — ORAL CARE MOUTH RINSE: 15.0000 mL | Freq: Once | OROMUCOSAL | Status: AC

## 2020-11-07 ENCOUNTER — Other Ambulatory Visit: Payer: Self-pay

## 2020-11-07 ENCOUNTER — Encounter: Payer: Self-pay | Admitting: Obstetrics and Gynecology

## 2020-11-07 ENCOUNTER — Inpatient Hospital Stay: Payer: 59 | Admitting: Registered Nurse

## 2020-11-07 ENCOUNTER — Encounter
Admission: RE | Disposition: A | Discharge status: Home / Self Care | Payer: Self-pay | Source: Home / Self Care | Attending: Obstetrics and Gynecology

## 2020-11-07 ENCOUNTER — Inpatient Hospital Stay
Admission: RE | Admit: 2020-11-07 | Discharge: 2020-11-10 | DRG: 787 | Disposition: A | Discharge status: Home / Self Care | Payer: 59 | Attending: Obstetrics and Gynecology | Admitting: Obstetrics and Gynecology

## 2020-11-07 DIAGNOSIS — O34211 Maternal care for low transverse scar from previous cesarean delivery: Secondary | ICD-10-CM | POA: Diagnosis present

## 2020-11-07 DIAGNOSIS — O99824 Streptococcus B carrier state complicating childbirth: Secondary | ICD-10-CM | POA: Diagnosis present

## 2020-11-07 DIAGNOSIS — D6959 Other secondary thrombocytopenia: Secondary | ICD-10-CM | POA: Diagnosis present

## 2020-11-07 DIAGNOSIS — D62 Acute posthemorrhagic anemia: Secondary | ICD-10-CM | POA: Diagnosis not present

## 2020-11-07 DIAGNOSIS — O99891 Other specified diseases and conditions complicating pregnancy: Secondary | ICD-10-CM | POA: Diagnosis not present

## 2020-11-07 DIAGNOSIS — O09293 Supervision of pregnancy with other poor reproductive or obstetric history, third trimester: Secondary | ICD-10-CM | POA: Diagnosis not present

## 2020-11-07 DIAGNOSIS — O99113 Other diseases of the blood and blood-forming organs and certain disorders involving the immune mechanism complicating pregnancy, third trimester: Secondary | ICD-10-CM

## 2020-11-07 DIAGNOSIS — O9912 Other diseases of the blood and blood-forming organs and certain disorders involving the immune mechanism complicating childbirth: Secondary | ICD-10-CM

## 2020-11-07 DIAGNOSIS — O09299 Supervision of pregnancy with other poor reproductive or obstetric history, unspecified trimester: Secondary | ICD-10-CM

## 2020-11-07 DIAGNOSIS — O34219 Maternal care for unspecified type scar from previous cesarean delivery: Secondary | ICD-10-CM

## 2020-11-07 DIAGNOSIS — O99892 Other specified diseases and conditions complicating childbirth: Secondary | ICD-10-CM

## 2020-11-07 DIAGNOSIS — Z3A39 39 weeks gestation of pregnancy: Secondary | ICD-10-CM

## 2020-11-07 DIAGNOSIS — O09529 Supervision of elderly multigravida, unspecified trimester: Secondary | ICD-10-CM

## 2020-11-07 DIAGNOSIS — Z85820 Personal history of malignant melanoma of skin: Secondary | ICD-10-CM | POA: Diagnosis not present

## 2020-11-07 DIAGNOSIS — O9081 Anemia of the puerperium: Secondary | ICD-10-CM | POA: Diagnosis not present

## 2020-11-07 DIAGNOSIS — O09523 Supervision of elderly multigravida, third trimester: Secondary | ICD-10-CM | POA: Diagnosis not present

## 2020-11-07 DIAGNOSIS — D696 Thrombocytopenia, unspecified: Secondary | ICD-10-CM | POA: Diagnosis not present

## 2020-11-07 DIAGNOSIS — Z20822 Contact with and (suspected) exposure to covid-19: Secondary | ICD-10-CM | POA: Diagnosis present

## 2020-11-07 LAB — TYPE AND SCREEN
ABO/RH(D): O NEG
Antibody Screen: POSITIVE
Extend sample reason: UNDETERMINED

## 2020-11-07 SURGERY — Surgical Case
Anesthesia: Spinal

## 2020-11-07 MED ORDER — OXYTOCIN-SODIUM CHLORIDE 30-0.9 UT/500ML-% IV SOLN
INTRAVENOUS | Status: AC
  Administered 2020-11-07: 2.5 [IU]/h via INTRAVENOUS
  Filled 2020-11-07: qty 1000

## 2020-11-07 MED ORDER — MORPHINE SULFATE (PF) 0.5 MG/ML IJ SOLN
INTRAMUSCULAR | Status: DC | PRN
  Administered 2020-11-07: .1 mg via EPIDURAL

## 2020-11-07 MED ORDER — GLYCOPYRROLATE 0.2 MG/ML IJ SOLN
INTRAMUSCULAR | Status: DC | PRN
Start: 2020-11-07 — End: 2020-11-07
  Administered 2020-11-07: .2 mg via INTRAVENOUS

## 2020-11-07 MED ORDER — LACTATED RINGERS IV BOLUS
500.0000 mL | Freq: Once | INTRAVENOUS | Status: AC
  Administered 2020-11-07: 500 mL via INTRAVENOUS

## 2020-11-07 MED ORDER — BUPIVACAINE IN DEXTROSE 0.75-8.25 % IT SOLN
INTRATHECAL | Status: DC | PRN
  Administered 2020-11-07: 1.8 mL via INTRATHECAL

## 2020-11-07 MED ORDER — ONDANSETRON HCL 4 MG/2ML IJ SOLN
INTRAMUSCULAR | Status: DC | PRN
  Administered 2020-11-07: 4 mg via INTRAVENOUS

## 2020-11-07 MED ORDER — METHYLERGONOVINE MALEATE 0.2 MG/ML IJ SOLN
INTRAMUSCULAR | Status: AC
  Filled 2020-11-07: qty 1

## 2020-11-07 MED ORDER — DIPHENHYDRAMINE HCL 25 MG PO CAPS: 25.0000 mg | ORAL_CAPSULE | Freq: Four times a day (QID) | ORAL | Status: DC | PRN

## 2020-11-07 MED ORDER — EPHEDRINE 5 MG/ML INJ
5.0000 mg | INTRAVENOUS | Status: DC | PRN
  Administered 2020-11-07 (×3): 5 mg via INTRAVENOUS
  Filled 2020-11-07: qty 1

## 2020-11-07 MED ORDER — ESCITALOPRAM OXALATE 10 MG PO TABS
10.0000 mg | ORAL_TABLET | Freq: Every day | ORAL | Status: DC
  Filled 2020-11-07 (×3): qty 1

## 2020-11-07 MED ORDER — PHENYLEPHRINE HCL (PRESSORS) 10 MG/ML IV SOLN
INTRAVENOUS | Status: AC
  Filled 2020-11-07: qty 1

## 2020-11-07 MED ORDER — SIMETHICONE 80 MG PO CHEW
80.0000 mg | CHEWABLE_TABLET | Freq: Three times a day (TID) | ORAL | Status: DC
  Administered 2020-11-07 – 2020-11-10 (×8): 80 mg via ORAL
  Filled 2020-11-07 (×8): qty 1

## 2020-11-07 MED ORDER — KETOROLAC TROMETHAMINE 30 MG/ML IJ SOLN
30.0000 mg | Freq: Four times a day (QID) | INTRAMUSCULAR | Status: DC
  Administered 2020-11-07 – 2020-11-08 (×3): 30 mg via INTRAVENOUS
  Filled 2020-11-07 (×3): qty 1

## 2020-11-07 MED ORDER — EPHEDRINE SULFATE 50 MG/ML IJ SOLN
INTRAMUSCULAR | Status: DC | PRN
  Administered 2020-11-07: 5 mg via INTRAVENOUS
  Administered 2020-11-07: 10 mg via INTRAVENOUS

## 2020-11-07 MED ORDER — FENTANYL CITRATE (PF) 100 MCG/2ML IJ SOLN: 25.0000 ug | INTRAMUSCULAR | Status: DC | PRN

## 2020-11-07 MED ORDER — BUPIVACAINE HCL (PF) 0.5 % IJ SOLN
INTRAMUSCULAR | Status: AC
  Filled 2020-11-07: qty 30

## 2020-11-07 MED ORDER — BUPIVACAINE 0.25 % ON-Q PUMP DUAL CATH 400 ML
400.0000 mL | INJECTION | Status: DC
  Filled 2020-11-07: qty 400

## 2020-11-07 MED ORDER — EPHEDRINE 5 MG/ML INJ
INTRAVENOUS | Status: AC
  Administered 2020-11-07: 5 mg via INTRAVENOUS
  Filled 2020-11-07: qty 4

## 2020-11-07 MED ORDER — SOD CITRATE-CITRIC ACID 500-334 MG/5ML PO SOLN
ORAL | Status: AC
  Administered 2020-11-07: 30 mL via ORAL
  Filled 2020-11-07: qty 15

## 2020-11-07 MED ORDER — SIMETHICONE 80 MG PO CHEW: 80.0000 mg | CHEWABLE_TABLET | ORAL | Status: DC | PRN

## 2020-11-07 MED ORDER — MENTHOL 3 MG MT LOZG
1.0000 | LOZENGE | OROMUCOSAL | Status: DC | PRN
  Filled 2020-11-07: qty 9

## 2020-11-07 MED ORDER — BUPIVACAINE HCL (PF) 0.5 % IJ SOLN
INTRAMUSCULAR | Status: DC | PRN
  Administered 2020-11-07: 10 mL

## 2020-11-07 MED ORDER — ONDANSETRON HCL 4 MG/2ML IJ SOLN: 4.0000 mg | Freq: Once | INTRAMUSCULAR | Status: DC | PRN

## 2020-11-07 MED ORDER — IBUPROFEN 600 MG PO TABS: 600.0000 mg | ORAL_TABLET | Freq: Four times a day (QID) | ORAL | Status: DC

## 2020-11-07 MED ORDER — OXYCODONE-ACETAMINOPHEN 5-325 MG PO TABS: 1.0000 | ORAL_TABLET | ORAL | Status: DC | PRN

## 2020-11-07 MED ORDER — OXYTOCIN-SODIUM CHLORIDE 30-0.9 UT/500ML-% IV SOLN
2.5000 [IU]/h | INTRAVENOUS | Status: AC
  Administered 2020-11-07: 2.5 [IU]/h via INTRAVENOUS
  Filled 2020-11-07: qty 500

## 2020-11-07 MED ORDER — DIBUCAINE (PERIANAL) 1 % EX OINT: 1.0000 "application " | TOPICAL_OINTMENT | CUTANEOUS | Status: DC | PRN

## 2020-11-07 MED ORDER — MISOPROSTOL 200 MCG PO TABS
ORAL_TABLET | ORAL | Status: AC
  Filled 2020-11-07: qty 5

## 2020-11-07 MED ORDER — MEPERIDINE HCL 25 MG/ML IJ SOLN: 6.2500 mg | INTRAMUSCULAR | Status: DC | PRN

## 2020-11-07 MED ORDER — MORPHINE SULFATE (PF) 0.5 MG/ML IJ SOLN
INTRAMUSCULAR | Status: AC
  Filled 2020-11-07: qty 10

## 2020-11-07 MED ORDER — LACTATED RINGERS IV SOLN: INTRAVENOUS | Status: DC

## 2020-11-07 MED ORDER — PHENYLEPHRINE HCL (PRESSORS) 10 MG/ML IV SOLN
INTRAVENOUS | Status: DC | PRN
  Administered 2020-11-07: 100 ug via INTRAVENOUS

## 2020-11-07 MED ORDER — WITCH HAZEL-GLYCERIN EX PADS: 1.0000 "application " | MEDICATED_PAD | CUTANEOUS | Status: DC | PRN

## 2020-11-07 MED ORDER — PRENATAL MULTIVITAMIN CH
1.0000 | ORAL_TABLET | Freq: Every day | ORAL | Status: DC
  Administered 2020-11-08 – 2020-11-10 (×3): 1 via ORAL
  Filled 2020-11-07 (×3): qty 1

## 2020-11-07 MED ORDER — FENTANYL CITRATE (PF) 100 MCG/2ML IJ SOLN
INTRAMUSCULAR | Status: AC
  Filled 2020-11-07: qty 2

## 2020-11-07 MED ORDER — SENNOSIDES-DOCUSATE SODIUM 8.6-50 MG PO TABS
2.0000 | ORAL_TABLET | ORAL | Status: DC
  Administered 2020-11-07 – 2020-11-08 (×2): 2 via ORAL
  Filled 2020-11-07 (×3): qty 2

## 2020-11-07 MED ORDER — SODIUM CHLORIDE 0.9 % IV SOLN
INTRAVENOUS | Status: DC | PRN
  Administered 2020-11-07: 50 ug/min via INTRAVENOUS

## 2020-11-07 MED ORDER — LIDOCAINE HCL (PF) 1 % IJ SOLN
INTRAMUSCULAR | Status: DC | PRN
  Administered 2020-11-07: 3 mL via SUBCUTANEOUS

## 2020-11-07 MED ORDER — COCONUT OIL OIL
1.0000 "application " | TOPICAL_OIL | Status: DC | PRN
  Filled 2020-11-07: qty 120

## 2020-11-07 MED ORDER — CARBOPROST TROMETHAMINE 250 MCG/ML IM SOLN
INTRAMUSCULAR | Status: AC
  Filled 2020-11-07: qty 1

## 2020-11-07 MED ORDER — FENTANYL CITRATE (PF) 100 MCG/2ML IJ SOLN
INTRAMUSCULAR | Status: DC | PRN
  Administered 2020-11-07: 15 ug via INTRAVENOUS

## 2020-11-07 SURGICAL SUPPLY — 30 items
BACTOSHIELD CHG 4% 4OZ (MISCELLANEOUS) ×1
BAG COUNTER SPONGE EZ (MISCELLANEOUS) ×2 IMPLANT
CATH KIT ON-Q SILVERSOAK 5IN (CATHETERS) ×4 IMPLANT
CHLORAPREP W/TINT 26 (MISCELLANEOUS) ×4 IMPLANT
DERMABOND ADVANCED (GAUZE/BANDAGES/DRESSINGS) ×1
DERMABOND ADVANCED .7 DNX12 (GAUZE/BANDAGES/DRESSINGS) ×1 IMPLANT
DRSG OPSITE POSTOP 4X10 (GAUZE/BANDAGES/DRESSINGS) ×2 IMPLANT
DRSG TELFA 3X8 NADH (GAUZE/BANDAGES/DRESSINGS) ×2 IMPLANT
ELECT CAUTERY BLADE 6.4 (BLADE) ×2 IMPLANT
ELECT REM PT RETURN 9FT ADLT (ELECTROSURGICAL) ×2
ELECTRODE REM PT RTRN 9FT ADLT (ELECTROSURGICAL) ×1 IMPLANT
GAUZE SPONGE 4X4 12PLY STRL (GAUZE/BANDAGES/DRESSINGS) ×2 IMPLANT
GLOVE SURG ENC MOIS LTX SZ7 (GLOVE) ×2 IMPLANT
GLOVE SURG UNDER LTX SZ7.5 (GLOVE) ×2 IMPLANT
GOWN STRL REUS W/ TWL LRG LVL3 (GOWN DISPOSABLE) ×3 IMPLANT
GOWN STRL REUS W/TWL LRG LVL3 (GOWN DISPOSABLE) ×3
MANIFOLD NEPTUNE II (INSTRUMENTS) ×2 IMPLANT
MAT PREVALON FULL STRYKER (MISCELLANEOUS) ×2 IMPLANT
NS IRRIG 1000ML POUR BTL (IV SOLUTION) ×2 IMPLANT
PACK C SECTION AR (MISCELLANEOUS) ×2 IMPLANT
PAD OB MATERNITY 4.3X12.25 (PERSONAL CARE ITEMS) ×2 IMPLANT
PAD PREP 24X41 OB/GYN DISP (PERSONAL CARE ITEMS) ×2 IMPLANT
PENCIL SMOKE EVACUATOR (MISCELLANEOUS) ×2 IMPLANT
SCRUB CHG 4% DYNA-HEX 4OZ (MISCELLANEOUS) ×1 IMPLANT
STRIP CLOSURE SKIN 1/2X4 (GAUZE/BANDAGES/DRESSINGS) ×2 IMPLANT
SUT MNCRL AB 4-0 PS2 18 (SUTURE) ×2 IMPLANT
SUT PDS AB 1 TP1 96 (SUTURE) ×4 IMPLANT
SUT VIC AB 0 CTX 36 (SUTURE) ×3
SUT VIC AB 0 CTX36XBRD ANBCTRL (SUTURE) ×3 IMPLANT
SUT VIC AB 2-0 CT1 36 (SUTURE) ×2 IMPLANT

## 2020-11-07 NOTE — Discharge Summary (Signed)
OB Discharge Summary     Patient Name: Dawn Gibbs DOB: 01-18-1986 MRN: [id number]  Date of admission: 11/07/2020 Delivering MD: Malachy Mood   Date of discharge: 11/10/2020  Admitting diagnosis: Pregnancy with history of cesarean section, antepartum [O34.219] Intrauterine pregnancy: [redacted]w[redacted]d     Secondary diagnosis:  Principal Problem:   Supervision of multigravida of advanced maternal age, antepartum Active Problems:   Encounter for maternal care for scar from repeat cesarean delivery   Postpartum care following cesarean delivery   Encounter for care or examination of lactating mother  Additional problems: Gestational thrombocytopenia     Discharge diagnosis: Term Pregnancy Delivered                                                                                                Post partum procedures: none  Augmentation: N/A  Complications: None  Hospital course:  Sceduled C/S   35 y.o. yo [id number] at [redacted]w[redacted]d was admitted to the hospital 11/07/2020 for scheduled cesarean section with the following indication:Elective Repeat.Delivery details are as follows:  Membrane Rupture Time/Date:  ,11/07/2020   Delivery Method:C-Section, Low Transverse  Details of operation can be found in separate operative note.    Patient had an uncomplicated postpartum course.  She is tolerating regular diet, her pain is controlled with PO medication, she is ambulating and voiding without difficulty. She reports breastfeeding is going well.   Patient is discharged home in stable condition on  11/10/20        Newborn Data: Birth date:11/07/2020  Birth time:10:59 AM  Gender:Female  [place] Living status:Living  Apgars:9 ,9  Weight:3580 g     Physical exam  Vitals:   11/09/20 1129 11/09/20 2112 11/10/20 0439 11/10/20 0833  BP: 110/61 106/64 113/69 97/61  Pulse: 63 (!) 54 (!) 49 63  Resp: 18 17 18 18   Temp: 98 F (36.7 C) 97.7 F (36.5 C) 97.9 F (36.6 C) 97.8 F (36.6 C)  TempSrc: Oral  Oral Oral Oral  SpO2: 100% 98% 98% 98%  Weight:      Height:       General: alert, cooperative, and no distress Lochia: appropriate Uterine Fundus: firm Incision: Healing well with no significant drainage, Dressing is clean, dry, and intact, on q intact DVT Evaluation: No evidence of DVT seen on physical exam. Labs: Lab Results  Component Value Date   WBC 9.2 11/08/2020   HGB 9.2 (L) 11/08/2020   HCT 25.7 (L) 11/08/2020   MCV 102.4 (H) 11/08/2020   PLT 90 (L) 11/08/2020   CMP Latest Ref Rng & Units 11/05/2020  Glucose 70 - 99 mg/dL 110(H)  BUN 6 - 20 mg/dL 14  Creatinine 0.44 - 1.00 mg/dL 0.75  Sodium 135 - 145 mmol/L 134(L)  Potassium 3.5 - 5.1 mmol/L 3.2(L)  Chloride 98 - 111 mmol/L 101  CO2 22 - 32 mmol/L 24  Calcium 8.9 - 10.3 mg/dL 8.9  Total Protein 6.5 - 8.1 g/dL 6.1(L)  Total Bilirubin 0.3 - 1.2 mg/dL 0.7  Alkaline Phos 38 - 126 U/L 125  AST 15 - 41 U/L 22  ALT  0 - 44 U/L 13    Discharge instruction: per After Visit Summary and "Baby and Me Booklet".  After visit meds:  Allergies as of 11/10/2020       Reactions   Nickel Rash   Macrobid [nitrofurantoin Monohyd Macro] Hives   Latex Rash   adhesive        Medication List     TAKE these medications    escitalopram 10 MG tablet Commonly known as: LEXAPRO Take 1 tablet (10 mg total) by mouth daily. What changed: See the new instructions.   Magnesium Gluconate 250 MG Tabs Take 250 mg by mouth daily.   multivitamin-prenatal 27-0.8 MG Tabs tablet Take 1 tablet by mouth daily at 12 noon.   oxyCODONE 5 MG immediate release tablet Commonly known as: Roxicodone Take 1 tablet (5 mg total) by mouth every 6 (six) hours as needed for up to 5 days for severe pain.               Discharge Care Instructions  (From admission, onward)           Start     Ordered   11/10/20 0000  Discharge wound care:       Comments: Keep incision dry, clean.   11/10/20 0950            Diet: routine  diet  Activity: Advance as tolerated. Pelvic rest for 6 weeks.   Outpatient follow up:1 week with Dr Georgianne Fick for incision check Follow up Appt:No future appointments. Follow up Visit:No follow-ups on file.  Postpartum contraception: Vasectomy and may want POP's in the interim    Newborn Delivery   Birth date/time: 11/07/2020 10:59:00 Delivery type: C-Section, Low Transverse Trial of labor: No C-section categorization: Repeat      Baby Feeding: Breast Disposition:home with mother   11/10/2020 Rod Can, CNM

## 2020-11-07 NOTE — Anesthesia Procedure Notes (Signed)
Spinal  Patient location during procedure: OR Start time: 11/07/2020 10:30 AM End time: 11/07/2020 10:32 AM Reason for block: surgical anesthesia Staffing Performed: resident/CRNA  Anesthesiologist: Phill Mutter, MD Resident/CRNA: Aline Brochure, CRNA Preanesthetic Checklist Completed: patient identified, IV checked, site marked, risks and benefits discussed, surgical consent, monitors and equipment checked, pre-op evaluation and timeout performed Spinal Block Patient position: sitting Prep: ChloraPrep Patient monitoring: heart rate, continuous pulse ox, blood pressure and cardiac monitor Approach: midline Location: L3-4 Injection technique: single-shot Needle Needle type: Introducer and Pencan  Needle gauge: 24 G Needle length: 9 cm Assessment Sensory level: T10 Events: CSF return Additional Notes Negative paresthesia. Negative blood return. Positive free-flowing CSF. Expiration date of kit checked and confirmed. Patient tolerated procedure well, without complications.

## 2020-11-07 NOTE — Progress Notes (Signed)
Pt arrived to L/D for scheduled C/S.

## 2020-11-07 NOTE — Lactation Note (Signed)
This note was copied from a baby's chart. Lactation Consultation Note  Patient Name: Dawn Gibbs Date: 11/07/2020 Reason for consult: Follow-up assessment;Term;Other (Comment) (c-section) Age:36 hours  Lactation follow-up. Baby was still asleep on chest when mom arrived.  LC hand expressed from R breast almost 98mL, and then moved to L breast. Baby did wake at this time, multiple attempts made to latch baby, baby continued to push and grunt, laid beside mom. LC hand expressed and spoon fed 3mL of expressed colostrum. Baby tolerated feed well and became more alert and interested in feeding.  Mom with hx of using a nipple shield was given size 43mm NS, baby at this time finally latched easily and had on/off rhythmic suck pattern, audible swallows identified by both mom and LC. LC encouraged to leave baby at breast as long as he was active, and as long as he wanted to.   Reviewed early feeding cues, feeding on demand instead of the clock, output expectations, and normal feeding patterns and behaviors.  Maternal Data Has patient been taught Hand Expression?: Yes Does the patient have breastfeeding experience prior to this delivery?: Yes How long did the patient breastfeed?: 57yrs  Feeding Mother's Current Feeding Choice: Breast Milk  LATCH Score Latch: Grasps breast easily, tongue down, lips flanged, rhythmical sucking.  Audible Swallowing: A few with stimulation  Type of Nipple: Everted at rest and after stimulation  Comfort (Breast/Nipple): Soft / non-tender  Hold (Positioning): Assistance needed to correctly position infant at breast and maintain latch.  LATCH Score: 8   Lactation Tools Discussed/Used Tools: Nipple Shields Nipple shield size: 20  Interventions Interventions: Assisted with latch;Hand express;Adjust position;Support pillows;Expressed milk;Education (spoon fed)  Discharge Pump: Personal  Consult Status Consult Status: Follow-up Date:  11/07/20 Follow-up type: Call as needed    Lavonia Drafts 11/07/2020, 2:47 PM

## 2020-11-07 NOTE — Transfer of Care (Signed)
Immediate Anesthesia Transfer of Care Note  Patient: Dawn Gibbs  Procedure(s) Performed: CESAREAN SECTION  Patient Location: LDR 2  Anesthesia Type:Spinal  Level of Consciousness: awake and alert   Airway & Oxygen Therapy: Patient Spontanous Breathing  Post-op Assessment: Report given to RN and Post -op Vital signs reviewed and stable  Post vital signs: Reviewed and stable  Last Vitals:  Vitals Value Taken Time  BP 97/61   Temp    Pulse 82   Resp 12   SpO2 98     Last Pain:  Vitals:   11/07/20 1028  TempSrc:   PainSc: 0-No pain         Complications: No notable events documented.

## 2020-11-07 NOTE — Interval H&P Note (Signed)
History and Physical Interval Note:  11/07/2020 9:57 AM  Dawn Gibbs  has presented today for surgery, with the diagnosis of Repeat cesarean.  The various methods of treatment have been discussed with the patient and family. After consideration of risks, benefits and other options for treatment, the patient has consented to  Procedure(s): CESAREAN SECTION (N/A) as a surgical intervention.  The patient's history has been reviewed, patient examined, no change in status, stable for surgery.  I have reviewed the patient's chart and labs.  Questions were answered to the patient's satisfaction.     Malachy Mood

## 2020-11-07 NOTE — Lactation Note (Signed)
This note was copied from a baby's chart. Lactation Consultation Note  Patient Name: Dawn Gibbs RUEAV'W Date: 11/07/2020 Reason for consult: L&D Initial assessment;Term;Other (Comment) (sch c-section) Age:36 hours  Lactation called in to [place] <2hr post c-section to third time mom. Mom has BF hx of 79yr with both other children and cites that initial latch was the main issues.  Baby asleep in dad's arms. Blankets removed and he woke easily. Several attempts made to get baby interested/latched at the breast with hand expression and stimulation. Baby did not feed, continued to push away, grunting noted, and fell asleep.  Baby brought to mom's chest skin to skin non-cuing. Transition updated. LC will continue to follow mom through first feeding, and PRN after that.  Maternal Data Has patient been taught Hand Expression?: Yes Does the patient have breastfeeding experience prior to this delivery?: Yes How long did the patient breastfeed?: 12yrs  Feeding Mother's Current Feeding Choice: Breast Milk  LATCH Score Latch: Too sleepy or reluctant, no latch achieved, no sucking elicited.                  Lactation Tools Discussed/Used    Interventions Interventions: Breast feeding basics reviewed;Assisted with latch;Skin to skin;Hand express;Adjust position;Support pillows;Position options;Education  Discharge    Consult Status Consult Status: Follow-up Date: 11/07/20 Follow-up type: In-patient    Lavonia Drafts 11/07/2020, 1:24 PM

## 2020-11-07 NOTE — Op Note (Addendum)
Preoperative Diagnosis: 1) 35 y.o. [id number] at [redacted]w[redacted]d and history of prior cesarean section 2) Gestational thrombocytopenia 3) Advanced maternal age 58) History of melanoma  Postoperative Diagnosis: 1) 35 y.o. [id number] at [redacted]w[redacted]d and history of prior cesarean section 2) Gestational thrombocytopenia 3) Advanced maternal age 73) History of melanoma  Operation Performed: Repeat low transverse C-section via pfannenstiel skin incision  Indiciation: Prior cesarean desiring repeat  Anesthesia: Spinal  Primary Surgeon: Malachy Mood, MD   Assistant: Prentice Docker, MD this surgery required a high level surgical assistant with none other readily available  Preoperative Antibiotics: 2g Ancef  Estimated Blood Loss: 865mL  IV Fluids: 13106mL  Drains or Tubes: Foley to gravity drainage, ON-Q catheter system  Implants: none  Specimens Removed: placenta  Complications: none  Intraoperative Findings:  Normal tubes ovaries and uterus.  Delivery resulted in the birth of a liveborn female, APGAR (1 MIN): 9   APGAR (5 MINS): 9  Patient Condition:stable  Procedure in Detail:  Patient was taken to the operating room were she was administered regional anesthesia.  She was positioned in the supine position, prepped and draped in the  Usual sterile fashion.  Prior to proceeding with the case a time out was performed and the level of anesthetic was checked and noted to be adequate.  Utilizing the scalpel a pfannenstiel skin incision was made 2cm above the pubic symphysis utilizing the patient's pre-existing scar and carried down sharply to the the level of the rectus fascia.  The fascia was incised in the midline using the scalpel and then extended using mayo scissors.  The superior border of the rectus fascia was grasped with two Kocher clamps and the underlying rectus muscles were dissected of the fascia using blunt dissection.  The median raphae was incised using Mayo scissors.   The inferior border of  the rectus fascia was dissected of the rectus muscles in a similar fashion.  The midline was identified, the peritoneum was entered bluntly and expanded using manual tractions.  The uterus was noted to be in a none rotated position.  Next the bladder blade was placed retracting the bladder caudally.  A bladder flap was not created.  A low transverse incision was scored on the lower uterine segment.  The hysterotomy was entered bluntly using the operators finger.  The hysterotomy incision was extended using manual traction.  The operators hand was placed within the hysterotomy position noting the fetus to be within the OA position.  The vertex was grasped, flexed, brought to the incision, and delivered a traumatically using fundal pressure applied by Dr. Glennon Mac.  The remainder of the body delivered with ease.  The infant was suctioned, cord was clamped and cut before handing off to the awaiting neonatologist.  The placenta was delivered using manual extraction.  The uterus was exteriorized, wiped clean of clots and debris using two moist laps.  The hysterotomy was closed using a two layer closure of 0 Vicryl, with the first being a running locked, the second a vertical imbricating.  The uterus was returned to the abdomen.  The peritoneal gutters were wiped clean of clots and debris using two moist laps.  The hysterotomy incision was re-inspected noted to be hemostatic.  The rectus muscles were inspected noted to be hemostatic.  The superior border of the rectus fascia was grasped with a Kocher clamp.  The ON-Q trocars were then placed 4cm above the superior border of the incision and tunneled subfascially.  The introducers were removed and the catheters  were threaded through the sleeves after which the sleeves were removed.  The fascia was closed using a looped #1 PDS in a running fashion taking 1cm by 1cm bites.  The subcutaneous tissue was irrigated using warm saline, hemostasis achieved using the bovie.  The  subcutaneous dead space was less than 3cm and was not closed.  The skin was closed using 4-0 Monocryl in a subcuticular fashion.  Sponge needle and instrument counts were corrects times two.  The patient tolerated the procedure well and was taken to the recovery room in stable condition.

## 2020-11-07 NOTE — Anesthesia Preprocedure Evaluation (Signed)
Anesthesia Evaluation  Patient identified by MRN, date of birth, ID band Patient awake    Reviewed: Allergy & Precautions, NPO status , Patient's Chart, lab work & pertinent test results  History of Anesthesia Complications (+) PONV and history of anesthetic complications  Airway Mallampati: II  TM Distance: >3 FB Neck ROM: Full    Dental no notable dental hx.    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   Platelets decreased ([place]) 107 11/05/20  PONV (postoperative nausea and vomiting)  nausea only per pt 10/24/20     Reproductive/Obstetrics (+) Pregnancy                             Anesthesia Physical  Anesthesia Plan  ASA: 2  Anesthesia Plan: Spinal   Post-op Pain Management:    Induction:   PONV Risk Score and Plan: 3 and Ondansetron  Airway Management Planned: Nasal Cannula and Natural Airway  Additional Equipment:   Intra-op Plan:   Post-operative Plan:   Informed Consent: I have reviewed the patients History and Physical, chart, labs and discussed the procedure including the risks, benefits and alternatives for the proposed anesthesia with the patient or authorized representative who has indicated his/her understanding and acceptance.     Dental advisory given  Plan Discussed with: CRNA, Surgeon and  Anesthesiologist  Anesthesia Plan Comments: (Spinal narcotics for post op pain control)        Anesthesia Quick Evaluation

## 2020-11-07 NOTE — Anesthesia Postprocedure Evaluation (Signed)
Anesthesia Post Note  Patient: Dawn Gibbs  Procedure(s) Performed: CESAREAN SECTION  Patient location during evaluation: PACU Anesthesia Type: Spinal Level of consciousness: awake and alert, awake and oriented Pain management: pain level controlled Vital Signs Assessment: post-procedure vital signs reviewed and stable Respiratory status: spontaneous breathing, nonlabored ventilation and respiratory function stable Cardiovascular status: blood pressure returned to baseline and stable Postop Assessment: no apparent nausea or vomiting Anesthetic complications: no   No notable events documented.   Last Vitals:  Vitals:   11/07/20 1258 11/07/20 1300  BP:  101/71  Pulse: 70 73  Resp: 15 15  Temp:    SpO2: 99% 98%    Last Pain:  Vitals:   11/07/20 1210  TempSrc:   PainSc: 0-No pain                 Phill Mutter

## 2020-11-08 ENCOUNTER — Encounter: Payer: Self-pay | Admitting: Obstetrics and Gynecology

## 2020-11-08 LAB — CBC
HCT: 25.7 % — ABNORMAL LOW (ref 36.0–46.0)
Hemoglobin: 9.2 g/dL — ABNORMAL LOW (ref 12.0–15.0)
MCH: 36.7 pg — ABNORMAL HIGH (ref 26.0–34.0)
MCHC: 35.8 g/dL (ref 30.0–36.0)
MCV: 102.4 fL — ABNORMAL HIGH (ref 80.0–100.0)
Platelets: 90 10*3/uL — ABNORMAL LOW (ref 150–400)
RBC: 2.51 MIL/uL — ABNORMAL LOW (ref 3.87–5.11)
RDW: 12.9 % (ref 11.5–15.5)
WBC: 9.2 10*3/uL (ref 4.0–10.5)
nRBC: 0 % (ref 0.0–0.2)

## 2020-11-08 MED ORDER — ACETAMINOPHEN 500 MG PO TABS
500.0000 mg | ORAL_TABLET | Freq: Four times a day (QID) | ORAL | Status: DC | PRN
  Administered 2020-11-08 – 2020-11-10 (×6): 500 mg via ORAL
  Filled 2020-11-08 (×6): qty 1

## 2020-11-08 MED ORDER — IBUPROFEN 600 MG PO TABS
600.0000 mg | ORAL_TABLET | Freq: Four times a day (QID) | ORAL | Status: DC
  Administered 2020-11-08 – 2020-11-10 (×7): 600 mg via ORAL
  Filled 2020-11-08 (×7): qty 1

## 2020-11-08 NOTE — Lactation Note (Signed)
This note was copied from a baby's chart. Lactation Consultation Note  Patient Name: Dawn Gibbs [id number] Date: 11/08/2020 Reason for consult: Mother's request Age:37 hours  Maternal Data  Mom prepumped to encourage latch, states her other 2 children would not breastfeed well until her milk increased after discharge  Feeding Mother's Current Feeding Choice: Breast Milk Baby very fussy at left breast, roots but moves head away from breast and cries, attempted in side lying and football, finally placed nipple shield on left breast after mom pumping to to provide colostrum to put in shield and baby swaddled baby finally latched  and nursed approx 5 min off and on , milk noted in shield and in corners of mouth, baby exhausted and fell asleep, mom to try again after eating her supper and baby resting.   LATCH Score Latch: Repeated attempts needed to sustain latch, nipple held in mouth throughout feeding, stimulation needed to elicit sucking reflex.  Audible Swallowing: Spontaneous and intermittent  Type of Nipple: Everted at rest and after stimulation  Comfort (Breast/Nipple): Filling, red/small blisters or bruises, mild/mod discomfort  Hold (Positioning): Assistance needed to correctly position infant at breast and maintain latch.  LATCH Score: 7   Lactation Tools Discussed/Used Tools: Nipple Shields Nipple shield size: 20  Interventions Interventions: Adjust position;Hand express;Pre-pump if needed;Assisted with latch  Discharge    Consult Status Consult Status: Follow-up Date: 11/09/20 Follow-up type: In-patient    Ferol Luz 11/08/2020, 7:40 PM

## 2020-11-08 NOTE — Progress Notes (Signed)
  Subjective:   She reports that she is feeling well.  Her pain is controlled.  She was able to have a bowel movement overnight.  She is breast-feeding.  She is having moderate to light bleeding.  She tolerated normal diet this morning.  Objective:  Blood pressure 105/64, pulse 61, temperature 98 F (36.7 C), temperature source Oral, resp. rate 20, height '5\' 4"'$  (1.626 m), weight 79 kg, last menstrual period 02/08/2020, SpO2 97 %, unknown if currently breastfeeding.  General: NAD Pulmonary: no increased work of breathing Abdomen: non-distended, non-tender, fundus firm at level of umbilicus Incision: clean dry intact Extremities: no edema, no erythema, no tenderness  Results for orders placed or performed during the hospital encounter of 11/07/20 (from the past 72 hour(s))  CBC     Status: Abnormal   Collection Time: 11/08/20  6:16 AM  Result Value Ref Range   WBC 9.2 4.0 - 10.5 K/uL   RBC 2.51 (L) 3.87 - 5.11 MIL/uL   Hemoglobin 9.2 (L) 12.0 - 15.0 g/dL   HCT 25.7 (L) 36.0 - 46.0 %   MCV 102.4 (H) 80.0 - 100.0 fL   MCH 36.7 (H) 26.0 - 34.0 pg   MCHC 35.8 30.0 - 36.0 g/dL   RDW 12.9 11.5 - 15.5 %   Platelets 90 (L) 150 - 400 K/uL    Comment: Immature Platelet Fraction may be clinically indicated, consider ordering this additional test [id number]    nRBC 0.0 0.0 - 0.2 %    Comment: Performed at [facility], [address], [place], [place] [address]     Assessment:   35 y.o. [id number] postoperativeday # 1   Plan:  1) Acute blood loss anemia - hemodynamically stable and asymptomatic - po ferrous sulfate  2) Blood Type --/--/O NEG (07/19 0843)   3) Rubella 8.37 (12/02 1618) / Varicella immune  4) Feeding- Breast   6) Contraception - not discussed  7) Disposition - continue care

## 2020-11-08 NOTE — Lactation Note (Addendum)
This note was copied from a baby's chart. Lactation Consultation Note  Patient Name: Dawn Gibbs [id number] Date: 11/08/2020 Reason for consult: Follow-up assessment;Term Age:36 hours  Maternal Data Has patient been taught Hand Expression?: Yes Does the patient have breastfeeding experience prior to this delivery?: Yes How long did the patient breastfeed?: 2 yrs  Feeding Mother's Current Feeding Choice: Breast Milk Baby gaggy at first, then very fussy at breast difficult to latch even with nipple shield on irght breast in football hold, latched much easier and calmer on left breast in cradle hold without nipple shield, nursed 30 min. LATCH Score Latch: Repeated attempts needed to sustain latch, nipple held in mouth throughout feeding, stimulation needed to elicit sucking reflex. (on right  breast, left breast latched easily without shield)  Audible Swallowing: Spontaneous and intermittent  Type of Nipple: Everted at rest and after stimulation  Comfort (Breast/Nipple): Soft / non-tender  Hold (Positioning): Assistance needed to correctly position infant at breast and maintain latch.  LATCH Score: 8   Lactation Tools Discussed/Used Tools: Nipple Shields;Pump;3F feeding tube / Syringe Nipple shield size: 20 Breast pump type: Double-Electric Breast Pump Pump Education: Setup, frequency, and cleaning;Milk Storage Reason for Pumping: to provide supp Pumping frequency: prn Pumped volume: 2 mL  Interventions Interventions: Breast feeding basics reviewed;Assisted with latch;Skin to skin;Hand express;Pre-pump if needed;Adjust position;Support pillows;DEBP;Education LC name and no written on white board Discharge Pump: DEBP;Personal WIC Program: No  Consult Status Consult Status: PRN Date: 11/08/20 Follow-up type: In-patient    Ferol Luz 11/08/2020, 12:43 PM

## 2020-11-09 NOTE — Progress Notes (Signed)
Obstetric Postpartum/PostOperative Daily Progress Note Subjective:  35 y.o. [id number] post-operative day # 2 status post repeat cesarean delivery.  She is ambulating, is tolerating po, is voiding spontaneously.  Her pain is well controlled on PO pain medications. Her lochia is less than menses. She reports baby is doing well with breastfeeding this morning. She may consider discharge later today.   Medications SCHEDULED MEDICATIONS   escitalopram  10 mg Oral Daily   ibuprofen  600 mg Oral Q6H   prenatal multivitamin  1 tablet Oral Q1200   senna-docusate  2 tablet Oral Q24H   simethicone  80 mg Oral TID PC    MEDICATION INFUSIONS   bupivacaine 0.25 % ON-Q pump DUAL CATH 400 mL Stopped (11/07/20 1438)   lactated ringers Stopped (11/08/20 0746)    PRN MEDICATIONS  acetaminophen, coconut oil, witch hazel-glycerin **AND** dibucaine, diphenhydrAMINE, ePHEDrine, menthol-cetylpyridinium, oxyCODONE-acetaminophen, simethicone    Objective:   Vitals:   11/08/20 0824 11/08/20 1645 11/09/20 0027 11/09/20 0813  BP: 105/64 108/65 (!) 106/56 (!) 111/51  Pulse: 61 62 (!) 59 (!) 54  Resp: '20 18 20 18  '$ Temp: 98 F (36.7 C) 97.8 F (36.6 C) (!) 97.4 F (36.3 C) 98.4 F (36.9 C)  TempSrc: Oral Oral Oral Oral  SpO2: 97%  98% 100%  Weight:      Height:        Current Vital Signs 24h Vital Sign Ranges  T 98.4 F (36.9 C) Temp  Avg: 97.9 F (36.6 C)  Min: 97.4 F (36.3 C)  Max: 98.4 F (36.9 C)  BP (!) 111/51  BP  Min: 106/56  Max: 111/51  HR (!) 54  Pulse  Avg: 58.3  Min: 54  Max: 62  RR 18 Resp  Avg: 18.7  Min: 18  Max: 20  SaO2 100 %  (room air) SpO2  Avg: 99 %  Min: 98 %  Max: 100 %       24 Hour I/O Current Shift I/O  Time Ins Outs 07/22 0701 - 07/23 0700 In: 252.6 [I.V.:252.6] Out: 800 [Urine:800] No intake/output data recorded.   General: NAD Pulmonary: no increased work of breathing Abdomen: non-distended, non-tender, fundus firm at level of umbilicus Inc: Clean/dry/intact,  on q intact Extremities: no edema, no erythema, no tenderness  Labs:  Recent Labs  Lab 11/05/20 0843 11/08/20 0616  WBC 7.6 9.2  HGB 12.2 9.2*  HCT 34.9* 25.7*  PLT 107* 90*     Assessment:   35 y.o. [id number] postoperative day # 2 status post repeat cesarean section, lactating  Plan:  1) Acute blood loss anemia - hemodynamically stable and asymptomatic - po ferrous sulfate  2) O NEG / Rubella 8.37 (12/02 1618)/ Varicella Immune  3) TDAP status offer prior to discharge  4) breastfeeding/Contraception = oral progesterone-only contraceptive, vasectomy  5) Disposition: continue current care and reevaluate for discharge later today   Rod Can, CNM 11/09/2020 9:24 AM

## 2020-11-10 MED ORDER — OXYCODONE HCL 5 MG PO TABS: 5.0000 mg | ORAL_TABLET | Freq: Four times a day (QID) | ORAL | 0 refills | Status: AC | PRN

## 2020-11-10 MED ORDER — ESCITALOPRAM OXALATE 10 MG PO TABS: 10.0000 mg | ORAL_TABLET | Freq: Every day | ORAL | 11 refills | Status: AC

## 2020-11-10 NOTE — Progress Notes (Signed)
Mother discharged.  Discharge instructions given.  Mother verbalizes understanding.  Transported by auxiliary.

## 2020-11-11 LAB — SURGICAL PATHOLOGY

## 2020-11-14 ENCOUNTER — Other Ambulatory Visit: Payer: Self-pay

## 2020-11-14 ENCOUNTER — Ambulatory Visit (INDEPENDENT_AMBULATORY_CARE_PROVIDER_SITE_OTHER): Payer: 59 | Admitting: Obstetrics and Gynecology

## 2020-11-14 VITALS — BP 130/83 | Ht 64.0 in | Wt 159.0 lb

## 2020-11-14 DIAGNOSIS — Z4889 Encounter for other specified surgical aftercare: Secondary | ICD-10-CM

## 2020-11-14 MED ORDER — NORETHINDRONE 0.35 MG PO TABS: 1.0000 | ORAL_TABLET | Freq: Every day | ORAL | 11 refills | Status: AC

## 2020-11-14 NOTE — Progress Notes (Signed)
Postoperative Follow-up Patient presents post op from [facility] ago for  elective repeat .  Subjective: Patient reports marked improvement in her preop symptoms. Eating a regular diet without difficulty. Pain is controlled without any medications.  Activity: normal activities of daily living.  Objective: Blood pressure 130/83, height '5\' 4"'$  (1.626 m), weight 159 lb (72.1 kg), unknown if currently breastfeeding.  General: NAD Pulmonary: no increased work of breathing Abdomen: soft, non-tender, non-distended, incision gD/C/I Extremities: no edema Neurologic: normal gait   Admission on 11/07/2020, Discharged on 11/10/2020  Component Date Value Ref Range Status   SURGICAL PATHOLOGY 11/07/2020    Final-Edited                   Value:SURGICAL PATHOLOGY CASE: [id number] PATIENT: Dawn Gibbs Surgical Pathology Report     Specimen Submitted: A. Placenta  Clinical History: Routine 39 week repeat C - Section, maternal history of melanoma     DIAGNOSIS: A. PLACENTA, THIRD TRIMESTER; CESAREAN SECTION: - PLACENTAL WEIGHT 528 GRAMS, APPROPRIATE FOR GESTATIONAL AGE. - MARGINAL VILLOUS INFARCTION, MINIMAL EXTENT (LESS THAN 5%, NOT CLINICALLY SIGNIFICANT). - NO CHORIOAMNIONITIS OR VILLITIS. - NO FEATURES SUGGESTIVE OF FETAL VASCULAR MALPERFUSION. - THREE-VESSEL UMBILICAL CORD.  GROSS DESCRIPTION: A. Labeled: Received labeled with the patient's name and date of birth (per requisition placenta) Received: Fresh Collection time: 1:47 PM on 11/07/2020 Placed into formalin time: 4:45 PM on 11/07/2020 Weight: 528 grams (trimmed of umbilical cord and membranes, post fixation) Size: 21 x 15.7 x 3 cm Shape: Ovoid Accessory lobes: None grossly identified. Membranes: The membranes insert marginally an                         d are tan, smooth, glistening, and translucent.  There are areas of slight thickening adjacent to the point of insertion. Umbilical cord:      Length  -8.5 cm in length x 1.5 cm in diameter      Number of vessels -3      Insertion -eccentric      Distance of insertion from margin -4.7 cm      Other findings -the umbilical cord is tan-white with 2 turns per the length of the specimen. Fetal surface: The fetal surface is blue-gray and glistening with peripheral scattered areas of white discoloration on the subchorionic plate, comprising less than 5% of the total surface area. Maternal surface: The uterine surface is red-brown and lobulated with torn but complete cotyledons. Comments: The parenchyma is red-brown, soft, and spongy with a 1.7 x 1.5 x 0.4 cm area of peripheral tan-white and firm discoloration.  This area comprises less than 5% of the total parenchyma.  Block summary: 1 - umbilical cord 2 - membrane rolls 3 - representative fetal surface discolorations 4                          - representative parenchymal discolorations 5 - 6 - representative grossly normal central placenta, full-thickness  RB 11/08/2020   Final Diagnosis performed by Bryan Lemma, MD.   Electronically signed 11/11/2020 6:25:32PM The electronic signature indicates that the named Attending Pathologist has evaluated the specimen Technical component performed at [facility], [address] [place], [place] [address] Lab: [phone] Dir: Rush Farmer, MD, MMM  Professional component performed at [facility], [facility], [place], [place], [place] [address] Lab: [phone] Dir: Dellia Nims. Rubinas, MD    WBC 11/08/2020 9.2  4.0 - 10.5 K/uL Final   RBC 11/08/2020 2.51 (A) 3.87 - 5.11 MIL/uL Final   Hemoglobin 11/08/2020 9.2 (A) 12.0 - 15.0 g/dL Final   HCT 11/08/2020 25.7 (A) 36.0 - 46.0 % Final   MCV 11/08/2020 102.4 (A) 80.0 - 100.0 fL Final   MCH 11/08/2020 36.7 (A) 26.0 - 34.0 pg Final   MCHC 11/08/2020 35.8  30.0 - 36.0 g/dL Final   RDW 11/08/2020 12.9  11.5 - 15.5 % Final   Platelets 11/08/2020 90 (A) 150 - 400 K/uL Final    Comment: Immature Platelet Fraction may be clinically indicated, consider ordering this additional test [id number]    nRBC 11/08/2020 0.0  0.0 - 0.2 % Final   Performed at [facility], [address], [place], [place] [address]    Assessment: 35 y.o. s/p RLTCS stable  Plan: Patient has done well after surgery with no apparent complications.  I have discussed the post-operative course to date, and the expected progress moving forward.  The patient understands what complications to be concerned about.  I will see the patient in routine follow up, or sooner if needed.    Activity plan: No heavy lifting.   Malachy Mood, MD, [facility], [facility] 11/14/2020, 2:14 PM

## 2020-11-20 NOTE — Telephone Encounter (Signed)
Patient is scheduled for 11:30 11/21/20 with SDJ

## 2020-11-21 ENCOUNTER — Encounter: Payer: Self-pay | Admitting: Obstetrics and Gynecology

## 2020-11-21 ENCOUNTER — Other Ambulatory Visit: Payer: Self-pay

## 2020-11-21 ENCOUNTER — Ambulatory Visit (INDEPENDENT_AMBULATORY_CARE_PROVIDER_SITE_OTHER): Payer: 59 | Admitting: Obstetrics and Gynecology

## 2020-11-21 VITALS — BP 104/68 | HR 91 | Temp 98.1°F | Ht 64.0 in | Wt 153.0 lb

## 2020-11-21 DIAGNOSIS — O9123 Nonpurulent mastitis associated with lactation: Secondary | ICD-10-CM | POA: Diagnosis not present

## 2020-11-21 MED ORDER — CEPHALEXIN 500 MG PO CAPS: 500.0000 mg | ORAL_CAPSULE | Freq: Four times a day (QID) | ORAL | 0 refills | Status: AC

## 2020-11-21 NOTE — Progress Notes (Signed)
Obstetrics & Gynecology Office Visit   Chief Complaint  Patient presents with   Breast Problem    Mastitis left breast    History of Present Illness: 35 y.o. [phone] female who is 2 weeks postpartum from a repeat c-section.   She has been breastfeeding her newborn without issues until two days ago when she noted redness at the top of her left breast. There was also mild tenderness.  She has not noted in particular any lumps or bumps.  She also had a fever of 101F. She took Tylenol and ibuprofen for this.  She denies issues with her incision, her spinal site, and any IV sites.  She has minimal-to-absent lochia.  Past Medical History:  Diagnosis Date   Medical history non-contributory    Melanoma in situ of left upper arm ([place]) 11/2016   Platelets decreased ([place])    PONV (postoperative nausea and vomiting)    nausea only per pt 10/24/20    Past Surgical History:  Procedure Laterality Date   CESAREAN SECTION N/A 05/03/2015   Procedure: CESAREAN SECTION;  Surgeon: Malachy Mood, MD;  Location: [facility] ORS;  Service: Obstetrics;  Laterality: N/A;   CESAREAN SECTION N/A 11/07/2020   Procedure: CESAREAN SECTION;  Surgeon: Malachy Mood, MD;  Location: [facility] ORS;  Service: Obstetrics;  Laterality: N/A;   RETINAL TEAR REPAIR CRYOTHERAPY Right 2008   TONSILLECTOMY      Gynecologic History: No LMP recorded.  Obstetric History: [id number]  Family History  Problem Relation Age of Onset   Healthy Mother    Healthy Father    Breast cancer Maternal Grandmother     Social History   Socioeconomic History   Marital status: Married    Spouse name: Not on file   Number of children: Not on file   Years of education: Not on file   Highest education level: Not on file  Occupational History   Not on file  Tobacco Use   Smoking status: Never   Smokeless tobacco: Never  Vaping Use   Vaping Use: Never used  Substance and Sexual Activity   Alcohol use: No   Drug use: No   Sexual  activity: Yes  Other Topics Concern   Not on file  Social History Narrative   Not on file   Social Determinants of Health   Financial Resource Strain: Not on file  Food Insecurity: Not on file  Transportation Needs: Not on file  Physical Activity: Not on file  Stress: Not on file  Social Connections: Not on file  Intimate Partner Violence: Not on file    Allergies  Allergen Reactions   Nickel Rash   Macrobid [Nitrofurantoin Monohyd Macro] Hives   Latex Rash    adhesive    Prior to Admission medications   Medication Sig Start Date End Date Taking? Authorizing Provider  escitalopram (LEXAPRO) 10 MG tablet Take 1 tablet (10 mg total) by mouth daily. 11/10/20  Yes Rod Can, CNM  Magnesium Gluconate 250 MG TABS Take 250 mg by mouth daily.   Yes [provider]  Prenatal Vit-Fe Fumarate-FA (MULTIVITAMIN-PRENATAL) 27-0.8 MG TABS tablet Take 1 tablet by mouth daily at 12 noon.   Yes [provider]  fluticasone (FLONASE) 50 MCG/ACT nasal spray Place 2 sprays into both nostrils daily. 10/24/19 10/24/19  Melynda Ripple, MD    Review of Systems  Constitutional:  Positive for chills and fever.  HENT: Negative.    Eyes: Negative.   Respiratory: Negative.    Cardiovascular: Negative.  Gastrointestinal: Negative.   Genitourinary: Negative.   Musculoskeletal: Negative.   Skin: Negative.        See HPI  Neurological: Negative.   Psychiatric/Behavioral: Negative.      Physical Exam BP 104/68 (Cuff Size: Normal)   Pulse 91   Temp 98.1 F (36.7 C)   Ht '5\' 4"'$  (1.626 m)   Wt 153 lb (69.4 kg)   Breastfeeding Yes   BMI 26.26 kg/m  No LMP recorded. Physical Exam Constitutional:      General: She is not in acute distress.    Appearance: Normal appearance.  Genitourinary:  Breasts:    Tanner Score is 5.     Right: No swelling, bleeding, inverted nipple, mass, nipple discharge, skin change or tenderness.     Left: Skin change and tenderness present. No  swelling, bleeding, inverted nipple, mass or nipple discharge.  HENT:     Head: Normocephalic and atraumatic.  Eyes:     General: No scleral icterus.    Conjunctiva/sclera: Conjunctivae normal.  Chest:    Abdominal:     General: There is no distension.     Palpations: Abdomen is soft. There is mass (uterus at U-4 and firm).     Tenderness: There is no abdominal tenderness. There is no guarding or rebound.     Comments: Incision: without erythema, induration, warmth, and tenderness. It is clean, dry, and intact.    Back: there is no erythema or tenderness at her spinal injection site.   Neurological:     General: No focal deficit present.     Mental Status: She is alert and oriented to person, place, and time.     Cranial Nerves: No cranial nerve deficit.  Psychiatric:        Mood and Affect: Mood normal.        Behavior: Behavior normal.        Judgment: Judgment normal.    Female chaperone present for pelvic and breast  portions of the physical exam  Assessment: 35 y.o. [id number] female here for  1. Nonpurulent mastitis associated with lactation      Plan: Problem List Items Addressed This Visit   None Visit Diagnoses     Nonpurulent mastitis associated with lactation    -  Primary   Relevant Medications   cephALEXin (KEFLEX) 500 MG capsule      Will treat for early mastitis with Keflex x 12 days. Follow up if symptoms don't improve.   A total of 21 minutes were spent face-to-face with the patient as well as preparation, review, communication, and documentation during this encounter.    Prentice Docker, MD 11/21/2020 12:13 PM

## 2020-12-20 ENCOUNTER — Other Ambulatory Visit: Payer: Self-pay

## 2020-12-20 ENCOUNTER — Encounter: Payer: Self-pay | Admitting: Obstetrics and Gynecology

## 2020-12-20 ENCOUNTER — Ambulatory Visit (INDEPENDENT_AMBULATORY_CARE_PROVIDER_SITE_OTHER): Payer: 59 | Admitting: Obstetrics and Gynecology

## 2020-12-20 NOTE — Progress Notes (Signed)
Postpartum Visit  Chief Complaint:  Chief Complaint  Patient presents with   Post-op Follow-up    6wk postpartum - no concerns. RM 5    History of Present Illness: Patient is a 35 y.o. [id number] presents for postpartum visit.  Date of delivery:  Cesarean Section: Elective repeat Pregnancy or labor problems:  gestational thrombocytopenia  Any problems since the delivery:  no  Newborn Details:  SINGLETON :  1. BabyGender female. Birth weight:  3580g Maternal Details:  Breast or formula feeding: plans to breastfeed Intercourse: No  Contraception after delivery: Yes  Any bowel or bladder issues: No  Post partum depression/anxiety noted:  no Edinburgh Post-Partum Depression Score:3 Date of last PAP: 6/22/2021NIL and HR HPV negative   Review of Systems: Review of Systems  Constitutional:  Negative for chills and fever.  HENT:  Negative for congestion.   Respiratory:  Negative for cough and shortness of breath.   Cardiovascular:  Negative for chest pain and palpitations.  Gastrointestinal:  Negative for abdominal pain, constipation, diarrhea, heartburn, nausea and vomiting.  Genitourinary:  Negative for dysuria, frequency and urgency.  Skin:  Negative for itching and rash.  Neurological:  Negative for dizziness and headaches.  Endo/Heme/Allergies:  Negative for polydipsia.  Psychiatric/Behavioral:  Negative for depression.    The following portions of the patient's history were reviewed and updated as appropriate: allergies, current medications, past family history, past medical history, past social history, past surgical history, and problem list.  Past Medical History:  Past Medical History:  Diagnosis Date   Medical history non-contributory    Melanoma in situ of left upper arm ([place]) 11/2016   Platelets decreased ([place])    PONV (postoperative nausea and vomiting)    nausea only per pt 10/24/20    Past Surgical History:  Past Surgical History:  Procedure Laterality  Date   CESAREAN SECTION N/A 05/03/2015   Procedure: CESAREAN SECTION;  Surgeon: Malachy Mood, MD;  Location: [facility];  Service: Obstetrics;  Laterality: N/A;   CESAREAN SECTION N/A 11/07/2020   Procedure: CESAREAN SECTION;  Surgeon: Malachy Mood, MD;  Location: [facility];  Service: Obstetrics;  Laterality: N/A;   RETINAL TEAR REPAIR CRYOTHERAPY Right 2008   TONSILLECTOMY      Family History:  Family History  Problem Relation Age of Onset   Healthy Mother    Healthy Father    Breast cancer Maternal Grandmother     Social History:  Social History   Socioeconomic History   Marital status: Married    Spouse name: Not on file   Number of children: Not on file   Years of education: Not on file   Highest education level: Not on file  Occupational History   Not on file  Tobacco Use   Smoking status: Never   Smokeless tobacco: Never  Vaping Use   Vaping Use: Never used  Substance and Sexual Activity   Alcohol use: No   Drug use: No   Sexual activity: Yes    Birth control/protection: Pill  Other Topics Concern   Not on file  Social History Narrative   Not on file   Social Determinants of Health   Financial Resource Strain: Not on file  Food Insecurity: Not on file  Transportation Needs: Not on file  Physical Activity: Not on file  Stress: Not on file  Social Connections: Not on file  Intimate Partner Violence: Not on file    Allergies:  Allergies  Allergen Reactions   Nickel  Rash   Macrobid [Nitrofurantoin Monohyd Macro] Hives   Latex Rash    adhesive    Medications: Prior to Admission medications   Medication Sig Start Date End Date Taking? Authorizing Provider  escitalopram (LEXAPRO) 10 MG tablet Take 1 tablet (10 mg total) by mouth daily. 11/10/20  Yes Rod Can, CNM  Prenatal Vit-Fe Fumarate-FA (MULTIVITAMIN-PRENATAL) 27-0.8 MG TABS tablet Take 1 tablet by mouth daily at 12 noon.   Yes [provider]  Magnesium Gluconate 250 MG TABS  Take 250 mg by mouth daily.    [provider]  norethindrone (MICRONOR) 0.35 MG tablet Take 1 tablet (0.35 mg total) by mouth daily. Patient not taking: Reported on 11/21/2020 11/14/20   Malachy Mood, MD  fluticasone [facility]) 50 MCG/ACT nasal spray Place 2 sprays into both nostrils daily. 10/24/19 10/24/19  Melynda Ripple, MD    Physical Exam Blood pressure 110/60, height '5\' 4"'$  (1.626 m), weight 151 lb (68.5 kg), currently breastfeeding.  General: NAD HEENT: normocephalic, anicteric Pulmonary: No increased work of breathing Abdomen: NABS, soft, non-tender, non-distended.  Umbilicus without lesions.  No hepatomegaly, splenomegaly or masses palpable. No evidence of hernia. Incision D/C/I Genitourinary:  External: Normal external female genitalia.  Normal urethral meatus, normal  Bartholin's and Skene's glands.    Vagina: Normal vaginal mucosa, no evidence of prolapse.    Cervix: Grossly normal in appearance, no bleeding  Uterus: Non-enlarged, mobile, normal contour.  No CMT  Adnexa: ovaries non-enlarged, no adnexal masses  Rectal: deferred Extremities: no edema, erythema, or tenderness Neurologic: Grossly intact Psychiatric: mood appropriate, affect full    Assessment: 35 y.o. [id number] presenting for 6 week postpartum visit  Plan: Problem List Items Addressed This Visit       Other   Encounter for care or examination of lactating mother - Primary   Other Visit Diagnoses     6 weeks postpartum follow-up           1) Contraception - Education given regarding options for contraception, as well as compatibility with breast feeding if applicable.  Patient plans on oral progesterone-only contraceptive for contraception.  2)  Pap - ASCCP guidelines and rational discussed.  ASCCP guidelines and rational discussed.  Patient opts for every 3 years screening interval  3) Patient underwent screening for postpartum depression with no signs of depression  4) Return in  about 1 year (around 12/20/2021) for annual.   Malachy Mood, MD, [place], [facility] 12/20/2020, 9:57 AM

## 2023-04-15 ENCOUNTER — Ambulatory Visit (INDEPENDENT_AMBULATORY_CARE_PROVIDER_SITE_OTHER): Payer: Managed Care, Other (non HMO)

## 2023-04-15 ENCOUNTER — Ambulatory Visit
Admission: RE | Admit: 2023-04-15 | Discharge: 2023-04-15 | Disposition: A | Discharge status: Home / Self Care | Payer: Managed Care, Other (non HMO) | Source: Ambulatory Visit | Attending: Physician Assistant | Admitting: Physician Assistant

## 2023-04-15 VITALS — BP 113/52 | HR 92 | Temp 102.4°F | Resp 18

## 2023-04-15 DIAGNOSIS — R051 Acute cough: Secondary | ICD-10-CM | POA: Diagnosis not present

## 2023-04-15 DIAGNOSIS — J189 Pneumonia, unspecified organism: Secondary | ICD-10-CM | POA: Diagnosis not present

## 2023-04-15 DIAGNOSIS — R509 Fever, unspecified: Secondary | ICD-10-CM

## 2023-04-15 MED ORDER — AMOXICILLIN-POT CLAVULANATE 875-125 MG PO TABS: 1.0000 | ORAL_TABLET | Freq: Two times a day (BID) | ORAL | 0 refills | Status: AC

## 2023-04-15 MED ORDER — PROMETHAZINE-DM 6.25-15 MG/5ML PO SYRP: 5.0000 mL | ORAL_SOLUTION | Freq: Four times a day (QID) | ORAL | 0 refills | Status: AC | PRN

## 2023-04-15 MED ORDER — AZITHROMYCIN 250 MG PO TABS: 250.0000 mg | ORAL_TABLET | Freq: Every day | ORAL | 0 refills | Status: AC

## 2023-04-15 NOTE — ED Provider Notes (Signed)
[facility]    CSN: [id number] Arrival date & time: 04/15/23  0945      History   Chief Complaint Chief Complaint  Patient presents with   Fever    Fever 3+ days hit 104 cough.  flu and Covid negative at home - Entered by patient   Cough   Generalized Body Aches    HPI Dawn Gibbs is a 37 y.o. female presenting for fever up to 104.5 degrees, cough, congestion, fatigue x 4 days.  Patient denies sore throat, chest pain or shortness of breath.  Has been taking ibuprofen and Tylenol around-the-clock for symptoms.  Current temp 102.4 degrees.  Patient says that her 3 children have been sick with cough and congestion a couple weeks ago but they do not have fevers and her feeling better.  She has no other complaints.  HPI  Past Medical History:  Diagnosis Date   Medical history non-contributory    Melanoma in situ of left upper arm ([facility]) 11/2016   Platelets decreased ([facility])    PONV (postoperative nausea and vomiting)    nausea only per pt 10/24/20    Patient Active Problem List   Diagnosis Date Noted   Encounter for care or examination of lactating mother 11/10/2020   Benign gestational thrombocytopenia in third trimester ([facility]) 09/06/2020   History of retinal tear 07/19/2020   History of decreased platelet count 03/21/2020   History of melanoma 02/18/2017    Past Surgical History:  Procedure Laterality Date   CESAREAN SECTION N/A 05/03/2015   Procedure: CESAREAN SECTION;  Surgeon: Vena Austria, MD;  Location: [facility];  Service: Obstetrics;  Laterality: N/A;   CESAREAN SECTION N/A 11/07/2020   Procedure: CESAREAN SECTION;  Surgeon: Vena Austria, MD;  Location: [facility];  Service: Obstetrics;  Laterality: N/A;   RETINAL TEAR REPAIR CRYOTHERAPY Right 2008   TONSILLECTOMY      OB History     Gravida  4   Para  3   Term  3   Preterm      AB  1   Living  3      SAB  1   IAB      Ectopic      Multiple  0   Live Births  3             Home Medications    Prior to Admission medications   Medication Sig Start Date End Date Taking? Authorizing Provider  amoxicillin-clavulanate (AUGMENTIN) 875-125 MG tablet Take 1 tablet by mouth every 12 (twelve) hours for 7 days. 04/15/23 04/22/23 Yes Shirlee Latch, PA-C  azithromycin (ZITHROMAX) 250 MG tablet Take 1 tablet (250 mg total) by mouth daily. Take first 2 tablets together, then 1 every day until finished. 04/15/23  Yes Eusebio Friendly B, PA-C  escitalopram (LEXAPRO) 10 MG tablet Take 1 tablet (10 mg total) by mouth daily. 11/10/20  Yes Tresea Mall, CNM  Prenatal Vit-Fe Fumarate-FA (MULTIVITAMIN-PRENATAL) 27-0.8 MG TABS tablet Take 1 tablet by mouth daily at 12 noon.   Yes [provider]  promethazine-dextromethorphan (PROMETHAZINE-DM) 6.25-15 MG/5ML syrup Take 5 mLs by mouth 4 (four) times daily as needed. 04/15/23  Yes Shirlee Latch, PA-C  Magnesium Gluconate 250 MG TABS Take 250 mg by mouth daily.    [provider]  norethindrone (MICRONOR) 0.35 MG tablet Take 1 tablet (0.35 mg total) by mouth daily. Patient not taking: Reported on 11/21/2020 11/14/20   Vena Austria, MD  fluticasone [facility]) 50  MCG/ACT nasal spray Place 2 sprays into both nostrils daily. 10/24/19 10/24/19  Domenick Gong, MD    Family History Family History  Problem Relation Age of Onset   Healthy Mother    Healthy Father    Breast cancer Maternal Grandmother     Social History Social History   Tobacco Use   Smoking status: Never   Smokeless tobacco: Never  Vaping Use   Vaping status: Never Used  Substance Use Topics   Alcohol use: No   Drug use: No     Allergies   Nickel, Macrobid [nitrofurantoin monohyd macro], and Latex   Review of Systems Review of Systems  Constitutional:  Positive for chills, fatigue and fever. Negative for diaphoresis.  HENT:  Positive for congestion. Negative for ear pain, rhinorrhea, sinus pressure, sinus pain and sore throat.    Respiratory:  Positive for cough. Negative for shortness of breath and wheezing.   Cardiovascular:  Negative for chest pain.  Gastrointestinal:  Negative for abdominal pain, nausea and vomiting.  Musculoskeletal:  Negative for arthralgias and myalgias.  Skin:  Negative for rash.  Neurological:  Negative for weakness and headaches.  Hematological:  Negative for adenopathy.     Physical Exam Triage Vital Signs ED Triage Vitals  Encounter Vitals Group     BP 04/15/23 1156 (!) 113/52     Systolic BP Percentile --      Diastolic BP Percentile --      Pulse Rate 04/15/23 1156 92     Resp 04/15/23 1156 18     Temp 04/15/23 1156 (!) 102.4 F (39.1 C)     Temp Source 04/15/23 1156 Oral     SpO2 04/15/23 1156 98 %     Weight --      Height --      Head Circumference --      Peak Flow --      Pain Score 04/15/23 1155 4     Pain Loc --      Pain Education --      Exclude from Growth Chart --    No data found.  Updated Vital Signs BP (!) 113/52 (BP Location: Left Arm)   Pulse 92   Temp (!) 102.4 F (39.1 C) (Oral)   Resp 18   LMP 04/01/2023 (Approximate)   SpO2 98%   Breastfeeding No       Physical Exam Vitals and nursing note reviewed.  Constitutional:      General: She is not in acute distress.    Appearance: Normal appearance. She is ill-appearing. She is not toxic-appearing.  HENT:     Head: Normocephalic and atraumatic.     Nose: Congestion present.     Mouth/Throat:     Mouth: Mucous membranes are moist.     Pharynx: Oropharynx is clear.  Eyes:     General: No scleral icterus.       Right eye: No discharge.        Left eye: No discharge.     Conjunctiva/sclera: Conjunctivae normal.  Cardiovascular:     Rate and Rhythm: Normal rate and regular rhythm.     Heart sounds: Normal heart sounds.  Pulmonary:     Effort: Pulmonary effort is normal. No respiratory distress.     Breath sounds: Normal breath sounds. No wheezing, rhonchi or rales.  Musculoskeletal:      Cervical back: Neck supple.  Skin:    General: Skin is dry.  Neurological:     General: No  focal deficit present.     Mental Status: She is alert. Mental status is at baseline.     Motor: No weakness.     Gait: Gait normal.  Psychiatric:        Mood and Affect: Mood normal.        Behavior: Behavior normal.      UC Treatments / Results  Labs (all labs ordered are listed, but only abnormal results are displayed) Labs Reviewed - No data to display  EKG   Radiology DG Chest 2 View Result Date: 04/15/2023 CLINICAL DATA:  Fever and cough for 4 days. EXAM: CHEST - 2 VIEW COMPARISON:  None Available. FINDINGS: The heart size and mediastinal contours are within normal limits. Airspace disease is seen in the lingula, consistent with pneumonia. No pleural effusion. IMPRESSION: Lingular airspace disease, consistent with pneumonia. Electronically Signed   By: Danae Orleans M.D.   On: 04/15/2023 12:15    Procedures Procedures (including critical care time)  Medications Ordered in UC Medications - No data to display  Initial Impression / Assessment and Plan / UC Course  I have reviewed the triage vital signs and the nursing notes.  Pertinent labs & imaging results that were available during my care of the patient were reviewed by me and considered in my medical decision making (see chart for details).   37 year old female presents for fever, cough, congestion, fatigue x 4 days.  Negative COVID and flu test at home x 2.  Tmax 104.5 degrees.  Current temp 102.4 degrees.  Patient is ill-appearing but nontoxic.  On exam has nasal congestion.  Throat is clear.  Chest clear to auscultation.   Will obtain chest x-ray to assess for pneumonia.  Wet read with concern for left-sided pneumonia.  X-ray over read shows lingular pneumonia.  Discussed this with patient.  Advised treating her with antibiotics.  Sent Augmentin and azithromycin to pharmacy.  Encouraged increasing rest and fluids  continue antibiotics.  Reviewed ED precautions.  Reviewed repeat x-ray in 3 to 4 weeks to ensure clearance of pneumonia.  1 acute illness with systemic symptoms.   Final Clinical Impressions(s) / UC Diagnoses   Final diagnoses:  Fever, unspecified  Acute cough  Lingular pneumonia     Discharge Instructions      -You have pneumonia.  I sent 2 different antibiotics to pharmacy.  Complete both medications starting the same time. - Increase rest and fluids.  You should be breaking the fever and starting to feel a bit better in the next few days. - If you are not breaking the fever, symptoms are not improving in 2 to 3 days, cough worsens or you have increased weakness or breathing difficulty you need to go to the emergency department. - Repeat the x-ray in 3 to 4 weeks to make sure the pneumonia has cleared up.  You may follow-up with your primary care provider.  If you cannot see your PCP you may return to our department.     ED Prescriptions     Medication Sig Dispense Auth. Provider   amoxicillin-clavulanate (AUGMENTIN) 875-125 MG tablet Take 1 tablet by mouth every 12 (twelve) hours for 7 days. 14 tablet Eusebio Friendly B, PA-C   azithromycin (ZITHROMAX) 250 MG tablet Take 1 tablet (250 mg total) by mouth daily. Take first 2 tablets together, then 1 every day until finished. 6 tablet Shirlee Latch, PA-C   promethazine-dextromethorphan (PROMETHAZINE-DM) 6.25-15 MG/5ML syrup Take 5 mLs by mouth 4 (four) times daily as  needed. 118 mL Shirlee Latch, PA-C      PDMP not reviewed this encounter.   Shirlee Latch, PA-C 04/15/23 1248

## 2023-04-15 NOTE — ED Triage Notes (Signed)
Pt presents with a cough and fever x 4 days. PT has taken OTC flu and covid test x 2 and they were negative. Pt has taken OTC tylenol and ibuprofen for fever.

## 2023-04-15 NOTE — Discharge Instructions (Addendum)
-  You have pneumonia.  I sent 2 different antibiotics to pharmacy.  Complete both medications starting the same time. - Increase rest and fluids.  You should be breaking the fever and starting to feel a bit better in the next few days. - If you are not breaking the fever, symptoms are not improving in 2 to 3 days, cough worsens or you have increased weakness or breathing difficulty you need to go to the emergency department. - Repeat the x-ray in 3 to 4 weeks to make sure the pneumonia has cleared up.  You may follow-up with your primary care provider.  If you cannot see your PCP you may return to our department.

## 2023-04-30 ENCOUNTER — Ambulatory Visit: Payer: Self-pay
